# Patient Record
Sex: Female | Born: 1970 | Race: Black or African American | Hispanic: No | Marital: Single | Smoking: Current every day smoker
Health system: Southern US, Community
[De-identification: ages and names within clinical notes are randomized; demographics above are authoritative.]

## PROBLEM LIST (undated history)

## (undated) ENCOUNTER — Emergency Department (HOSPITAL_COMMUNITY): Admission: EM | Payer: Medicare Other | Source: Home / Self Care

## (undated) DIAGNOSIS — M329 Systemic lupus erythematosus, unspecified: Secondary | ICD-10-CM

## (undated) DIAGNOSIS — G5600 Carpal tunnel syndrome, unspecified upper limb: Secondary | ICD-10-CM

## (undated) DIAGNOSIS — IMO0002 Reserved for concepts with insufficient information to code with codable children: Secondary | ICD-10-CM

## (undated) HISTORY — PX: TUBAL LIGATION: SHX77

## (undated) HISTORY — PX: TOTAL HIP ARTHROPLASTY: SHX124

---

## 2009-05-01 ENCOUNTER — Emergency Department (HOSPITAL_COMMUNITY): Admission: EM | Admit: 2009-05-01 | Discharge: 2009-05-01 | Payer: Self-pay | Admitting: Family Medicine

## 2009-05-08 ENCOUNTER — Encounter: Admission: RE | Admit: 2009-05-08 | Discharge: 2009-05-08 | Payer: Self-pay | Admitting: Sports Medicine

## 2009-06-06 ENCOUNTER — Emergency Department (HOSPITAL_COMMUNITY): Admission: EM | Admit: 2009-06-06 | Discharge: 2009-06-06 | Payer: Self-pay | Admitting: Family Medicine

## 2009-06-28 DIAGNOSIS — M1611 Unilateral primary osteoarthritis, right hip: Secondary | ICD-10-CM | POA: Diagnosis present

## 2009-07-25 ENCOUNTER — Ambulatory Visit (HOSPITAL_COMMUNITY): Admission: RE | Admit: 2009-07-25 | Discharge: 2009-07-25 | Payer: Self-pay | Admitting: Orthopedic Surgery

## 2009-07-27 ENCOUNTER — Inpatient Hospital Stay (HOSPITAL_COMMUNITY): Admission: RE | Admit: 2009-07-27 | Discharge: 2009-07-30 | Payer: Self-pay | Admitting: Orthopedic Surgery

## 2010-01-01 DIAGNOSIS — E559 Vitamin D deficiency, unspecified: Secondary | ICD-10-CM | POA: Insufficient documentation

## 2010-03-31 ENCOUNTER — Encounter: Payer: Self-pay | Admitting: Family Medicine

## 2010-05-27 LAB — CBC
HCT: 23.4 % — ABNORMAL LOW (ref 36.0–46.0)
Hemoglobin: 7.9 g/dL — ABNORMAL LOW (ref 12.0–15.0)
MCHC: 33.8 g/dL (ref 30.0–36.0)
MCHC: 34.4 g/dL (ref 30.0–36.0)
MCV: 92.6 fL (ref 78.0–100.0)
Platelets: 181 10*3/uL (ref 150–400)
Platelets: 202 10*3/uL (ref 150–400)
Platelets: 215 10*3/uL (ref 150–400)
RBC: 2.53 MIL/uL — ABNORMAL LOW (ref 3.87–5.11)
RBC: 3.71 MIL/uL — ABNORMAL LOW (ref 3.87–5.11)
RDW: 13.4 % (ref 11.5–15.5)
RDW: 13.5 % (ref 11.5–15.5)
WBC: 10.3 10*3/uL (ref 4.0–10.5)
WBC: 13.3 10*3/uL — ABNORMAL HIGH (ref 4.0–10.5)
WBC: 5.9 10*3/uL (ref 4.0–10.5)

## 2010-05-27 LAB — URINALYSIS, ROUTINE W REFLEX MICROSCOPIC
Bilirubin Urine: NEGATIVE
Bilirubin Urine: NEGATIVE
Glucose, UA: NEGATIVE mg/dL
Ketones, ur: NEGATIVE mg/dL
Ketones, ur: NEGATIVE mg/dL
Nitrite: NEGATIVE
Nitrite: NEGATIVE
Protein, ur: NEGATIVE mg/dL
Specific Gravity, Urine: 1.019 (ref 1.005–1.030)
Specific Gravity, Urine: 1.02 (ref 1.005–1.030)
Urobilinogen, UA: 1 mg/dL (ref 0.0–1.0)
Urobilinogen, UA: 1 mg/dL (ref 0.0–1.0)
pH: 6 (ref 5.0–8.0)

## 2010-05-27 LAB — URINE MICROSCOPIC-ADD ON

## 2010-05-27 LAB — BASIC METABOLIC PANEL
BUN: 10 mg/dL (ref 6–23)
BUN: 2 mg/dL — ABNORMAL LOW (ref 6–23)
CO2: 28 mEq/L (ref 19–32)
Calcium: 8.8 mg/dL (ref 8.4–10.5)
Calcium: 8.9 mg/dL (ref 8.4–10.5)
Calcium: 9.4 mg/dL (ref 8.4–10.5)
Chloride: 96 mEq/L (ref 96–112)
Creatinine, Ser: 0.53 mg/dL (ref 0.4–1.2)
Creatinine, Ser: 0.55 mg/dL (ref 0.4–1.2)
Creatinine, Ser: 0.65 mg/dL (ref 0.4–1.2)
GFR calc Af Amer: >60 mL/min (ref >60)
GFR calc Af Amer: >60 mL/min (ref >60)
GFR calc non Af Amer: >60 mL/min (ref >60)
GFR calc non Af Amer: >60 mL/min (ref >60)
GFR calc non Af Amer: >60 mL/min (ref >60)
Glucose, Bld: 125 mg/dL — ABNORMAL HIGH (ref 70–99)
Glucose, Bld: 99 mg/dL (ref 70–99)
Potassium: 3.8 mEq/L (ref 3.5–5.1)
Sodium: 132 mEq/L — ABNORMAL LOW (ref 135–145)
Sodium: 135 mEq/L (ref 135–145)

## 2010-05-27 LAB — PROTIME-INR
INR: 0.99 (ref 0.00–1.49)
INR: 1.14 (ref 0.00–1.49)
INR: 1.48 (ref 0.00–1.49)
Prothrombin Time: 14.5 seconds (ref 11.6–15.2)
Prothrombin Time: 15.7 seconds — ABNORMAL HIGH (ref 11.6–15.2)
Prothrombin Time: 17.8 seconds — ABNORMAL HIGH (ref 11.6–15.2)

## 2010-05-27 LAB — URINE CULTURE

## 2010-05-27 LAB — DIFFERENTIAL
Basophils Relative: 0 % (ref 0–1)
Monocytes Relative: 12 % (ref 3–12)
Neutro Abs: 2.7 10*3/uL (ref 1.7–7.7)
Neutrophils Relative %: 45 % (ref 43–77)

## 2010-05-27 LAB — APTT: aPTT: 23 seconds — ABNORMAL LOW (ref 24–37)

## 2010-05-27 LAB — TYPE AND SCREEN
ABO/RH(D): A POS
Antibody Screen: NEGATIVE

## 2010-05-28 LAB — CBC
HCT: 33.5 % — ABNORMAL LOW (ref 36.0–46.0)
MCV: 91.3 fL (ref 78.0–100.0)
Platelets: 243 10*3/uL (ref 150–400)
RBC: 3.67 MIL/uL — ABNORMAL LOW (ref 3.87–5.11)
WBC: 6.8 10*3/uL (ref 4.0–10.5)

## 2010-05-28 LAB — BASIC METABOLIC PANEL
Chloride: 101 mEq/L (ref 96–112)
GFR calc Af Amer: >60 mL/min (ref >60)
GFR calc non Af Amer: >60 mL/min (ref >60)
Potassium: 4.4 mEq/L (ref 3.5–5.1)
Sodium: 133 mEq/L — ABNORMAL LOW (ref 135–145)

## 2010-05-28 LAB — PROTIME-INR
INR: 1.02 (ref 0.00–1.49)
Prothrombin Time: 13.3 seconds (ref 11.6–15.2)

## 2010-05-28 LAB — URINALYSIS, ROUTINE W REFLEX MICROSCOPIC
Bilirubin Urine: NEGATIVE
Ketones, ur: NEGATIVE mg/dL
Leukocytes, UA: NEGATIVE
Nitrite: NEGATIVE
Protein, ur: NEGATIVE mg/dL
Urobilinogen, UA: 1 mg/dL (ref 0.0–1.0)
pH: 6.5 (ref 5.0–8.0)

## 2010-05-28 LAB — APTT: aPTT: 26 seconds (ref 24–37)

## 2010-05-28 LAB — TYPE AND SCREEN: Antibody Screen: NEGATIVE

## 2010-05-28 LAB — DIFFERENTIAL
Eosinophils Absolute: 0.1 10*3/uL (ref 0.0–0.7)
Eosinophils Relative: 2 % (ref 0–5)
Lymphocytes Relative: 33 % (ref 12–46)
Lymphs Abs: 2.3 10*3/uL (ref 0.7–4.0)
Monocytes Relative: 9 % (ref 3–12)

## 2010-05-28 LAB — ABO/RH: ABO/RH(D): A POS

## 2011-09-08 ENCOUNTER — Encounter (HOSPITAL_COMMUNITY): Payer: Self-pay | Admitting: Emergency Medicine

## 2011-09-08 ENCOUNTER — Emergency Department (HOSPITAL_COMMUNITY)
Admission: EM | Admit: 2011-09-08 | Discharge: 2011-09-08 | Disposition: A | Discharge status: Home Health | Payer: Medicare Other | Attending: Emergency Medicine | Admitting: Emergency Medicine

## 2011-09-08 DIAGNOSIS — M79609 Pain in unspecified limb: Secondary | ICD-10-CM | POA: Insufficient documentation

## 2011-09-08 DIAGNOSIS — R209 Unspecified disturbances of skin sensation: Secondary | ICD-10-CM | POA: Insufficient documentation

## 2011-09-08 DIAGNOSIS — G629 Polyneuropathy, unspecified: Secondary | ICD-10-CM

## 2011-09-08 DIAGNOSIS — G5600 Carpal tunnel syndrome, unspecified upper limb: Secondary | ICD-10-CM | POA: Insufficient documentation

## 2011-09-08 HISTORY — DX: Carpal tunnel syndrome, unspecified upper limb: G56.00

## 2011-09-08 LAB — GLUCOSE, CAPILLARY: Glucose-Capillary: 107 mg/dL — ABNORMAL HIGH (ref 70–99)

## 2011-09-08 MED ORDER — DICLOFENAC SODIUM 75 MG PO TBEC: 75.0000 mg | DELAYED_RELEASE_TABLET | Freq: Two times a day (BID) | ORAL | Status: AC

## 2011-09-08 NOTE — ED Provider Notes (Signed)
History     CSN: [id number]  Arrival date & time 09/08/11  4098   First MD Initiated Contact with Patient 09/08/11 1001      10:26 AM HPI   Patient is a 41 y.o. female presenting with lower extremity pain. The history is provided by the patient.  Foot Pain This is a new problem. Episode onset: 2 weeks. The problem occurs constantly. The problem has been gradually worsening. Associated symptoms include numbness. Pertinent negatives include no chills, fever, joint swelling, myalgias, nausea, neck pain, vomiting or weakness. The symptoms are aggravated by walking and standing. She has tried rest for the symptoms.    Past Medical History  Diagnosis Date  . Carpal tunnel syndrome     Past Surgical History  Procedure Date  . Total hip arthroplasty     History reviewed. No pertinent family history.  History  Substance Use Topics  . Smoking status: Current Everyday Smoker  . Smokeless tobacco: Not on file  . Alcohol Use: Yes     occasional    OB History    Grav Para Term Preterm Abortions TAB SAB Ect Mult Living                  Review of Systems  Constitutional: Negative for fever and chills.  HENT: Negative for neck pain.   Gastrointestinal: Negative for nausea and vomiting.  Musculoskeletal: Negative for myalgias and joint swelling.       Bilateral foot pain  Neurological: Positive for numbness. Negative for weakness.  All other systems reviewed and are negative.    Allergies  Review of patient's allergies indicates no known allergies.  Home Medications  No current outpatient prescriptions on file.  BP 133/72  Pulse 84  Temp 98.5 F (36.9 C) (Oral)  Resp 20  SpO2 96%  Physical Exam  Vitals reviewed. Constitutional: She is oriented to person, place, and time. Vital signs are normal. She appears well-developed and well-nourished. No distress.  HENT:  Head: Normocephalic and atraumatic.  Eyes: Pupils are equal, round, and reactive to light.  Neck: Neck  supple.  Pulmonary/Chest: Effort normal.  Musculoskeletal:       Bilateral foot: NML pulses. No edema, no erythema, Moderate TTP, no wounds or masses.   Neurological: She is alert and oriented to person, place, and time.  Skin: Skin is warm and dry. No rash noted. No erythema. No pallor.  Psychiatric: She has a normal mood and affect. Her behavior is normal.    ED Course  Procedures   Results for orders placed during the hospital encounter of 09/08/11  GLUCOSE, CAPILLARY      Component Value Range   Glucose-Capillary 107 (*) 70 - 99 mg/dL   Comment 1 BRACELET RIGHT NAME      MDM    Patient is describing a peripheral neuropathy. Will Treat pain with NSAIDs and prednisone. Advised follow-up with PCP or ortho. Patient voices understanding and is ready for d/c     Thomasene Lot, PA-C 09/08/11 1127

## 2011-09-08 NOTE — Discharge Instructions (Signed)
Peripheral Neuropathy Peripheral neuropathy is a common disorder of your nerves resulting from damage. CAUSES  This disorder may be caused by a disease of the nerves or illness. Many neuropathies have well known causes such as:  Diabetes. This is one of the most common causes.   Uremia.   AIDS.   Nutritional deficiencies.   Other causes include mechanical pressures. These may be from:   Compression.   Injury.   Contusions or bruises.   Fracture or dislocated bones.   Pressure involving the nerves close to the surface. Nerves such as the ulnar, or radial can be injured by prolonged use of crutches.  Other injuries may come from:  Tumor.   Hemorrhage or bleeding into a nerve.   Exposure to cold or radiation.   Certain medicines or toxic substances (rare).   Vascular or collagen disorders such as:   Atherosclerosis.   Systemic lupus erythematosus.   Scleroderma.   Sarcoidosis.   Rheumatoid arthritis.   Polyarteritis nodosa.   A large number of cases are of unknown cause.  SYMPTOMS  Common problems include:  Weakness.   Numbness.   Abnormal sensations (paresthesia) such as:   Burning.   Tickling.   Pricking.   Tingling.   Pain in the arms, hands, legs and/or feet.  TREATMENT  Therapy for this disorder differs depending on the cause. It may vary from medical treatment with medications or physical therapy among others.   For example, therapy for this disorder caused by diabetes involves control of the diabetes.   In cases where a tumor or ruptured disc is the cause, therapy may involve surgery. This would be to remove the tumor or to repair the ruptured disc.   In entrapment or compression neuropathy, treatment may consist of splinting or surgical decompression of the ulnar or median nerves. A common example of entrapment neuropathy is carpal tunnel syndrome. This has become more common because of the increasing use of computers.   Peroneal and  radial compression neuropathies may require avoidance of pressure.   Physical therapy and/or splints may be useful in preventing contractures. This is a condition in which shortened muscles around joints cause abnormal and sometimes painful positioning of the joints.  Document Released: 02/14/2002 Document Revised: 11/06/2010 Document Reviewed: 02/24/2005 [employer] Patient Information 2012 [employer]. 

## 2011-09-08 NOTE — ED Provider Notes (Signed)
Medical screening examination/treatment/procedure(s) were performed by non-physician practitioner and as supervising physician I was immediately available for consultation/collaboration.   Glynn Octave, MD 09/08/11 [phone]

## 2011-09-08 NOTE — ED Notes (Signed)
Pt c/o bilateral foot pain and hip pain x 2 months; pt denies obvious injury; pt sts hx of hip replacement

## 2013-03-13 ENCOUNTER — Encounter (HOSPITAL_COMMUNITY): Payer: Self-pay | Admitting: Emergency Medicine

## 2013-03-13 ENCOUNTER — Emergency Department (HOSPITAL_COMMUNITY): Payer: Medicare Other

## 2013-03-13 ENCOUNTER — Emergency Department (HOSPITAL_COMMUNITY)
Admission: EM | Admit: 2013-03-13 | Discharge: 2013-03-13 | Disposition: A | Discharge status: Home / Self Care | Payer: Medicare Other | Attending: Emergency Medicine | Admitting: Emergency Medicine

## 2013-03-13 DIAGNOSIS — M25559 Pain in unspecified hip: Secondary | ICD-10-CM | POA: Insufficient documentation

## 2013-03-13 DIAGNOSIS — G5601 Carpal tunnel syndrome, right upper limb: Secondary | ICD-10-CM

## 2013-03-13 DIAGNOSIS — F17200 Nicotine dependence, unspecified, uncomplicated: Secondary | ICD-10-CM | POA: Insufficient documentation

## 2013-03-13 DIAGNOSIS — G8929 Other chronic pain: Secondary | ICD-10-CM | POA: Insufficient documentation

## 2013-03-13 DIAGNOSIS — Z9889 Other specified postprocedural states: Secondary | ICD-10-CM | POA: Insufficient documentation

## 2013-03-13 DIAGNOSIS — G5600 Carpal tunnel syndrome, unspecified upper limb: Secondary | ICD-10-CM | POA: Insufficient documentation

## 2013-03-13 MED ORDER — IBUPROFEN 800 MG PO TABS: 800.0000 mg | ORAL_TABLET | Freq: Three times a day (TID) | ORAL | Status: DC

## 2013-03-13 MED ORDER — HYDROCODONE-ACETAMINOPHEN 5-325 MG PO TABS: 1.0000 | ORAL_TABLET | Freq: Four times a day (QID) | ORAL | Status: DC | PRN

## 2013-03-13 NOTE — ED Notes (Signed)
Reports pain to right hip for several days. Had hip replaced 3 years ago but denies new injury. Having right hand pain also, hx of carpel tunnel. No acute distress noted at triage.

## 2013-03-13 NOTE — ED Notes (Signed)
Patient transported to X-ray 

## 2013-03-13 NOTE — ED Provider Notes (Signed)
CSN: [id number]     Arrival date & time 03/13/13  1102 History  This chart was scribed for Santiago Glad, PA working with Rolan Bucco, MD by Quintella Reichert, ED Scribe. This patient was seen in room TR08C/TR08C and the patient's care was started at 12:30 PM.   Chief Complaint  Patient presents with  . Hip Pain  . Hand Pain    The history is provided by the patient. No language interpreter was used.    HPI Comments: Lisa Foley is a 43 y.o. female who presents to the Emergency Department complaining of 2 days of constant moderate right hip pain.  Pt has h/o total hip replacement around 3 years ago (Dr. Turner Daniels) and has had some pain recurrently since then.  It had been some time since her flare-up before her current episode.  Pain is constant and is not worsened by anything.  She denies redness or swelling of the hip.  She denies recent fevers or chills.  Pt has not attempted to treat pain pta due to alleged possible allergies to Tylenol and Aleve.  However she states that she has taken 800mg  ibuprofen before without complications.  She also received Percocet after her previous surgery without adverse effect.  Pt also complains of one month of intermittent pain and numbness to the right hand.  She has h/o carpal tunnel and uses a splint but states it has not provided relief.  She received carpal tunnel release surgery around 1996 in [place].  She has been told that she may need another surgery.   Past Medical History  Diagnosis Date  . Carpal tunnel syndrome     Past Surgical History  Procedure Laterality Date  . Total hip arthroplasty    . Tubal ligation      History reviewed. No pertinent family history.   History  Substance Use Topics  . Smoking status: Current Every Day Smoker  . Smokeless tobacco: Not on file  . Alcohol Use: Yes     Comment: occasional    OB History   Grav Para Term Preterm Abortions TAB SAB Ect Mult Living                  Review of  Systems  Constitutional: Negative for fever and chills.  Musculoskeletal: Positive for arthralgias (right hip, right hand). Negative for joint swelling.     Allergies  Aleve and Tylenol  Home Medications  No current outpatient prescriptions on file.  BP 136/77  Pulse 91  Temp(Src) 99 F (37.2 C) (Oral)  Resp 20  Ht 5\' 7"  (1.702 m)  SpO2 97%  LMP 02/18/2013  Physical Exam  Nursing note and vitals reviewed. Constitutional: She appears well-developed and well-nourished.  HENT:  Head: Normocephalic and atraumatic.  Mouth/Throat: Oropharynx is clear and moist.  Eyes: EOM are normal. Pupils are equal, round, and reactive to light.  Neck: Normal range of motion. Neck supple.  Cardiovascular: Normal rate, regular rhythm and normal heart sounds.   Pulses:      Dorsalis pedis pulses are 2+ on the right side.  Pulmonary/Chest: Effort normal and breath sounds normal. She has no wheezes.  Musculoskeletal:       Right hip: She exhibits tenderness.  Tender to palpation over right lateral hip.  No erythema, edema or warmth.  Full ROM but mild pain with ROM, particularly abduction.   Old healed surgical scar over volar aspect of the right wrist.  Distal sensation of right fingers intact.  No erythema,  edema or wamrth of the wrist present.    Neurological: She is alert. No sensory deficit.  Skin: Skin is warm and dry.  Psychiatric: She has a normal mood and affect. Her behavior is normal.    ED Course  Procedures (including critical care time)  DIAGNOSTIC STUDIES: Oxygen Saturation is 97% on room air, normal by my interpretation.    COORDINATION OF CARE: 12:41 PM-Discussed treatment plan which includes x-ray with pt at bedside and pt agreed to plan.    Labs Review Labs Reviewed - No data to display   Imaging Review Dg Hip Complete Right  03/13/2013   CLINICAL DATA:  Right lateral hip pain.  EXAM: RIGHT HIP - COMPLETE 2+ VIEW  COMPARISON:  07/27/2009  FINDINGS: Right hip  prosthesis in place. Subtle lucency an bone adjacent to the acetabular component is stable from the immediate postoperative images and accordingly not ascribed to particulate disease. No fracture, acute bony finding, or complicating feature observed. There is some mild cortical thickening along the stem component of the implant.  IMPRESSION: 1. Subtle cortical thickening along the stem of the implant without cortical fracture. This may simply represent mild reactive thickening. No definite complicating feature. If pain persists despite conservative therapy, MARS protocol MRI may be helpful in further characterization.   Electronically Signed   By: Herbie BaltimoreWalt  Liebkemann M.D.   On: 03/13/2013 14:04    EKG Interpretation   None       MDM  No diagnosis found. Patient presenting with chronic hip pain.  History of hip replacement.  No signs of infection on exam.  No acute injury or trauma.  Patient discharged home with pain medications and instructed to follow up with Orthopedist.  Patient also complaining of one month of intermittent wrist pain and numbness.  Patient has had Carpal Tunnel surgery in the past. Patient instructed to continue wearing splint and given follow up with Hand Surgery.  Patient stable for discharge.  I personally performed the services described in this documentation, which was scribed in my presence. The recorded information has been reviewed and is accurate.    Santiago GladHeather Ameen Mostafa, PA-C 03/15/13 2228

## 2013-03-15 NOTE — ED Provider Notes (Signed)
Medical screening examination/treatment/procedure(s) were performed by non-physician practitioner and as supervising physician I was immediately available for consultation/collaboration.  EKG Interpretation   None         Wayne Wicklund, MD 03/15/13 2302 

## 2013-04-12 ENCOUNTER — Emergency Department (HOSPITAL_COMMUNITY): Payer: Medicare Other

## 2013-04-12 ENCOUNTER — Emergency Department (HOSPITAL_COMMUNITY)
Admission: EM | Admit: 2013-04-12 | Discharge: 2013-04-12 | Disposition: A | Discharge status: Home / Self Care | Payer: Medicare Other | Attending: Emergency Medicine | Admitting: Emergency Medicine

## 2013-04-12 ENCOUNTER — Encounter (HOSPITAL_COMMUNITY): Payer: Self-pay | Admitting: Emergency Medicine

## 2013-04-12 DIAGNOSIS — M25559 Pain in unspecified hip: Secondary | ICD-10-CM | POA: Insufficient documentation

## 2013-04-12 DIAGNOSIS — B372 Candidiasis of skin and nail: Secondary | ICD-10-CM | POA: Insufficient documentation

## 2013-04-12 DIAGNOSIS — M25551 Pain in right hip: Secondary | ICD-10-CM

## 2013-04-12 DIAGNOSIS — Z9889 Other specified postprocedural states: Secondary | ICD-10-CM | POA: Insufficient documentation

## 2013-04-12 DIAGNOSIS — F17200 Nicotine dependence, unspecified, uncomplicated: Secondary | ICD-10-CM | POA: Insufficient documentation

## 2013-04-12 DIAGNOSIS — R21 Rash and other nonspecific skin eruption: Secondary | ICD-10-CM | POA: Insufficient documentation

## 2013-04-12 DIAGNOSIS — Z96649 Presence of unspecified artificial hip joint: Secondary | ICD-10-CM | POA: Insufficient documentation

## 2013-04-12 DIAGNOSIS — M25539 Pain in unspecified wrist: Secondary | ICD-10-CM | POA: Insufficient documentation

## 2013-04-12 DIAGNOSIS — M25531 Pain in right wrist: Secondary | ICD-10-CM

## 2013-04-12 DIAGNOSIS — Z79899 Other long term (current) drug therapy: Secondary | ICD-10-CM | POA: Insufficient documentation

## 2013-04-12 DIAGNOSIS — Z791 Long term (current) use of non-steroidal anti-inflammatories (NSAID): Secondary | ICD-10-CM | POA: Insufficient documentation

## 2013-04-12 MED ORDER — TRAMADOL HCL 50 MG PO TABS: 50.0000 mg | ORAL_TABLET | Freq: Four times a day (QID) | ORAL | Status: DC | PRN

## 2013-04-12 MED ORDER — CLOTRIMAZOLE 1 % EX CREA: TOPICAL_CREAM | CUTANEOUS | Status: DC

## 2013-04-12 NOTE — ED Provider Notes (Signed)
CSN: [id number]     Arrival date & time 04/12/13  1105 History   First MD Initiated Contact with Patient 04/12/13 1122    This chart was scribed for Francee PiccoloJennifer Whitnie Deleon PA-C, a non-physician practitioner working with No att. providers found by Lewanda RifeAlexandra Hurtado, ED Scribe. This patient was seen in room TR04C/TR04C and the patient's care was started at 12:38 PM     Chief Complaint  Patient presents with  . Hip Pain  . Hand Pain   (Consider location/radiation/quality/duration/timing/severity/associated sxs/prior Treatment) The history is provided by the patient. No language interpreter was used.   HPI Comments: Lisa Foley is a 43 y.o. female who presents to the Emergency Department with PMHx of right hip replacement (3 years), and carpal tunnel surgery of right hand (1996) complaining of right hip pain and right hand pain onset chronic, but worsening gradually for the past 2 weeks with no precipitating factors. Describes pain as sharp. Reports associated Reports symptoms are exacerbated at night. Denies any alleviating factors. Denies associated fever, recent fall, and recent trama, numbness, and weakness.  Additionally, reports constant worsening pruritic rash on back onset gradually worsening for 2 months. Reports she has a dog at home. Reports trying gold bond lotion with no relief of symptoms. Denies any aggravating factors. Denies associated new food, new medications, new lotions, new residence, new soaps, drainage, any pain, skin color change other than upper back, and other areas with similar rash.       Past Medical History  Diagnosis Date  . Carpal tunnel syndrome    Past Surgical History  Procedure Laterality Date  . Total hip arthroplasty    . Tubal ligation     No family history on file. History  Substance Use Topics  . Smoking status: Current Every Day Smoker  . Smokeless tobacco: Not on file  . Alcohol Use: Yes     Comment: occasional   OB History   Grav Para  Term Preterm Abortions TAB SAB Ect Mult Living                 Review of Systems  Constitutional: Negative for fever.  Musculoskeletal: Positive for arthralgias.  Skin: Positive for rash.  Neurological: Negative for weakness and numbness.  Psychiatric/Behavioral: Negative for confusion.    Allergies  Aleve and Tylenol  Home Medications   Current Outpatient Rx  Name  Route  Sig  Dispense  Refill  . HYDROcodone-acetaminophen (NORCO/VICODIN) 5-325 MG per tablet   Oral   Take 1-2 tablets by mouth every 6 (six) hours as needed.   20 tablet   0   . ibuprofen (ADVIL,MOTRIN) 800 MG tablet   Oral   Take 1 tablet (800 mg total) by mouth 3 (three) times daily.   21 tablet   0   . clotrimazole (LOTRIMIN) 1 % cream      Apply to affected area 2 times daily up to four weeks   15 g   0   . traMADol (ULTRAM) 50 MG tablet   Oral   Take 1 tablet (50 mg total) by mouth every 6 (six) hours as needed.   15 tablet   0    BP 117/62  Pulse 81  Temp(Src) 97.9 F (36.6 C) (Oral)  Resp 16  SpO2 100%  LMP 02/18/2013 Physical Exam  Constitutional: She is oriented to person, place, and time. She appears well-developed and well-nourished. No distress.  HENT:  Head: Normocephalic and atraumatic.  Right Ear: External ear normal.  Left Ear: External ear normal.  Nose: Nose normal.  Mouth/Throat: Oropharynx is clear and moist.  Eyes: Conjunctivae are normal.  Neck: Normal range of motion. Neck supple.  Cardiovascular: Normal rate.   Pulmonary/Chest: Effort normal.  Abdominal: Soft.  Musculoskeletal: Normal range of motion.  Neurological: She is alert and oriented to person, place, and time.  Skin: Skin is warm and dry. She is not diaphoretic.  Non-tender and non-vesicular. Rash consistent with candida noted to areas of skin folds on back.   Psychiatric: She has a normal mood and affect.    ED Course  Procedures  COORDINATION OF CARE:  Nursing notes reviewed. Vital signs  reviewed. Initial pt interview and examination performed.   12:39 PM-Discussed work up plan with pt at bedside, which includes x-ray of right hip pain, and x-ray of right hand. Pt agrees with plan.   Treatment plan initiated:Medications - No data to display   Initial diagnostic testing ordered.     Labs Review Labs Reviewed - No data to display Imaging Review Dg Hip Complete Right  04/12/2013   CLINICAL DATA:  Right hip pain.  Prior hip arthroplasty.  EXAM: RIGHT HIP - COMPLETE 2+ VIEW  COMPARISON:  DG HIP COMPLETE*R* dated 03/13/2013  FINDINGS: Stable radiographic appearance following prior placement of a right hip arthroplasty. No fracture or abnormal lucency is seen surrounding the prosthesis. There is stable mild cortical thickening along the medial aspect of the femoral stem.  IMPRESSION: Stable radiographic appearance of right hip arthroplasty.   Electronically Signed   By: Irish Lack M.D.   On: 04/12/2013 12:29   Dg Hand Complete Right  04/12/2013   CLINICAL DATA:  Right hand pain.  EXAM: RIGHT HAND - COMPLETE 3+ VIEW  COMPARISON:  None.  FINDINGS: There is no evidence of fracture or dislocation. There is no evidence of arthropathy or other focal bone abnormality. Soft tissues are unremarkable.  IMPRESSION: Negative.   Electronically Signed   By: Irish Lack M.D.   On: 04/12/2013 12:25   12:42 PM Nursing Notes Reviewed/ Care Coordinated Applicable Imaging Reviewed and incorporated into ED treatment Discussed results and treatment plan with pt. Pt demonstrates understanding and agrees with plan. Pt informed of return precautions and is comfortable with discharge at this time.      EKG Interpretation   None       MDM   1. Right wrist pain   2. Right hip pain   3. Rash and nonspecific skin eruption     Filed Vitals:   04/12/13 1320  BP: 117/62  Pulse: 81  Temp: 97.9 F (36.6 C)  Resp: 16   Afebrile, NAD, non-toxic appearing, AAOx4. Neurovascularly intact. Normal  sensation. Imaging reviewed.  1) Right wrist pain: pain consistent with carpal tunnel will brace wrist and provide hand surgeon f/u.  2) Hip Pain: No obvious injury. Likely musculoskeletal in nature. Will prescribe pain medication and ortho f/u.  3) Rash: Rash consistent with candidal infection, will prescribe topical antifungal.  Return precautions discussed. Patient is agreeable to plan. Patient is stable at time of discharge     I personally performed the services described in this documentation, which was scribed in my presence. The recorded information has been reviewed and is accurate.     Jeannetta Ellis, PA-C 04/12/13 1635

## 2013-04-12 NOTE — ED Notes (Signed)
Pt reports right hip pain and right hand pain. Pt has had hip replacement years ago and carpal tunnel surgery to right hand. Also reports rash to back x months.

## 2013-04-12 NOTE — Discharge Instructions (Signed)
Please follow up with your primary care physician in 1-2 days. If you do not have one please call the [facility] number listed above. Please follow up with Dr. Merlyn Lot, hand surgeon, and Dr. Eulah Pont, orthopedist, to schedule a follow up appointment. Please take pain medication as prescribed and as needed for pain. Please do not drive on narcotic pain medication. Please use cream as prescribed. Please read all discharge instructions and return precautions.   Rash A rash is a change in the color or texture of your skin. There are many different types of rashes. You may have other problems that accompany your rash. CAUSES   Infections.  Allergic reactions. This can include allergies to pets or foods.  Certain medicines.  Exposure to certain chemicals, soaps, or cosmetics.  Heat.  Exposure to poisonous plants.  Tumors, both cancerous and noncancerous. SYMPTOMS   Redness.  Scaly skin.  Itchy skin.  Dry or cracked skin.  Bumps.  Blisters.  Pain. DIAGNOSIS  Your caregiver may do a physical exam to determine what type of rash you have. A skin sample (biopsy) may be taken and examined under a microscope. TREATMENT  Treatment depends on the type of rash you have. Your caregiver may prescribe certain medicines. For serious conditions, you may need to see a skin doctor (dermatologist). HOME CARE INSTRUCTIONS   Avoid the substance that caused your rash.  Do not scratch your rash. This can cause infection.  You may take cool baths to help stop itching.  Only take over-the-counter or prescription medicines as directed by your caregiver.  Keep all follow-up appointments as directed by your caregiver. SEEK IMMEDIATE MEDICAL CARE IF:  You have increasing pain, swelling, or redness.  You have a fever.  You have new or severe symptoms.  You have body aches, diarrhea, or vomiting.  Your rash is not better after 3 days. MAKE SURE YOU:  Understand these  instructions.  Will watch your condition.  Will get help right away if you are not doing well or get worse. Document Released: 02/14/2002 Document Revised: 05/19/2011 Document Reviewed: 12/09/2010 [facility] Patient Information 2014 [place], [place].   Hip Pain The hips join the upper legs to the lower pelvis. The bones, cartilage, tendons, and muscles of the hip joint perform a lot of work each day holding your body weight and allowing you to move around. Hip pain is a common symptom. It can range from a minor ache to severe pain on 1 or both hips. Pain may be felt on the inside of the hip joint near the groin, or the outside near the buttocks and upper thigh. There may be swelling or stiffness as well. It occurs more often when a person walks or performs activity. There are many reasons hip pain can develop. CAUSES  It is important to work with your caregiver to identify the cause since many conditions can impact the bones, cartilage, muscles, and tendons of the hips. Causes for hip pain include:  Broken (fractured) bones.  Separation of the thighbone from the hip socket (dislocation).  Torn cartilage of the hip joint.  Swelling (inflammation) of a tendon (tendonitis), the sac within the hip joint (bursitis), or a joint.  A weakening in the abdominal wall (hernia), affecting the nerves to the hip.  Arthritis in the hip joint or lining of the hip joint.  Pinched nerves in the back, hip, or upper thigh.  A bulging disc in the spine (herniated disc).  Rarely, bone infection or  cancer. DIAGNOSIS  The location of your hip pain will help your caregiver understand what may be causing the pain. A diagnosis is based on your medical history, your symptoms, results from your physical exam, and results from diagnostic tests. Diagnostic tests may include X-ray exams, a computerized magnetic scan (magnetic resonance imaging, MRI), or bone scan. TREATMENT  Treatment will depend on the cause of  your hip pain. Treatment may include:  Limiting activities and resting until symptoms improve.  Crutches or other walking supports (a cane or brace).  Ice, elevation, and compression.  Physical therapy or home exercises.  Shoe inserts or special shoes.  Losing weight.  Medications to reduce pain.  Undergoing surgery. HOME CARE INSTRUCTIONS   Only take over-the-counter or prescription medicines for pain, discomfort, or fever as directed by your caregiver.  Put ice on the injured area:  Put ice in a plastic bag.  Place a towel between your skin and the bag.  Leave the ice on for 15-20 minutes at a time, 03-04 times a day.  Keep your leg raised (elevated) when possible to lessen swelling.  Avoid activities that cause pain.  Follow specific exercises as directed by your caregiver.  Sleep with a pillow between your legs on your most comfortable side.  Record how often you have hip pain, the location of the pain, and what it feels like. This information may be helpful to you and your caregiver.  Ask your caregiver about returning to work or sports and whether you should drive.  Follow up with your caregiver for further exams, therapy, or testing as directed. SEEK MEDICAL CARE IF:   Your pain or swelling continues or worsens after 1 week.  You are feeling unwell or have chills.  You have increasing difficulty with walking.  You have a loss of sensation or other new symptoms.  You have questions or concerns. SEEK IMMEDIATE MEDICAL CARE IF:   You cannot put weight on the affected hip.  You have fallen.  You have a sudden increase in pain and swelling in your hip.  You have a fever. MAKE SURE YOU:   Understand these instructions.  Will watch your condition.  Will get help right away if you are not doing well or get worse. Document Released: 08/14/2009 Document Revised: 05/19/2011 Document Reviewed: 08/14/2009 [facility] Patient Information 2014 [place],  [place].  Carpal Tunnel Syndrome The carpal tunnel is a narrow area located on the palm side of your wrist. The tunnel is formed by the wrist bones and ligaments. Nerves, blood vessels, and tendons pass through the carpal tunnel. Repeated wrist motion or certain diseases may cause swelling within the tunnel. This swelling pinches the main nerve in the wrist (median nerve) and causes the painful hand and arm condition called carpal tunnel syndrome. CAUSES   Repeated wrist motions.  Wrist injuries.  Certain diseases like arthritis, diabetes, alcoholism, hyperthyroidism, and kidney failure.  Obesity.  Pregnancy. SYMPTOMS   A "pins and needles" feeling in your fingers or hand.  Tingling or numbness in your fingers or hand.  An aching feeling in your entire arm.  Wrist pain that goes up your arm to your shoulder.  Pain that goes down into your palm or fingers.  A weak feeling in your hands. DIAGNOSIS  Your caregiver will take your history and perform a physical exam. An electromyography test may be needed. This test measures electrical signals sent out by the muscles. The electrical signals are usually slowed by carpal tunnel syndrome. You  may also need X-rays. TREATMENT  Carpal tunnel syndrome may clear up by itself. Your caregiver may recommend a wrist splint or medicine such as a nonsteroidal anti-inflammatory medicine. Cortisone injections may help. Sometimes, surgery may be needed to free the pinched nerve.  HOME CARE INSTRUCTIONS   Take all medicine as directed by your caregiver. Only take over-the-counter or prescription medicines for pain, discomfort, or fever as directed by your caregiver.  If you were given a splint to keep your wrist from bending, wear it as directed. It is important to wear the splint at night. Wear the splint for as long as you have pain or numbness in your hand, arm, or wrist. This may take 1 to 2 months.  Rest your wrist from any activity that may be  causing your pain. If your symptoms are work-related, you may need to talk to your employer about changing to a job that does not require using your wrist.  Put ice on your wrist after long periods of wrist activity.  Put ice in a plastic bag.  Place a towel between your skin and the bag.  Leave the ice on for 15-20 minutes, 03-04 times a day.  Keep all follow-up visits as directed by your caregiver. This includes any orthopedic referrals, physical therapy, and rehabilitation. Any delay in getting necessary care could result in a delay or failure of your condition to heal. SEEK IMMEDIATE MEDICAL CARE IF:   You have new, unexplained symptoms.  Your symptoms get worse and are not helped or controlled with medicines. MAKE SURE YOU:   Understand these instructions.  Will watch your condition.  Will get help right away if you are not doing well or get worse. Document Released: 02/22/2000 Document Revised: 05/19/2011 Document Reviewed: 01/10/2011 [facility] Patient Information 2014 [place].

## 2013-04-13 NOTE — ED Provider Notes (Signed)
Medical screening examination/treatment/procedure(s) were performed by non-physician practitioner and as supervising physician I was immediately available for consultation/collaboration.  EKG Interpretation   None         Charles B. Sheldon, MD 04/13/13 1610 

## 2014-03-12 ENCOUNTER — Emergency Department (HOSPITAL_COMMUNITY)
Admission: EM | Admit: 2014-03-12 | Discharge: 2014-03-12 | Disposition: A | Discharge status: Home / Self Care | Payer: Medicare Other | Attending: Emergency Medicine | Admitting: Emergency Medicine

## 2014-03-12 ENCOUNTER — Encounter (HOSPITAL_COMMUNITY): Payer: Self-pay | Admitting: *Deleted

## 2014-03-12 DIAGNOSIS — Z720 Tobacco use: Secondary | ICD-10-CM | POA: Diagnosis not present

## 2014-03-12 DIAGNOSIS — E271 Primary adrenocortical insufficiency: Secondary | ICD-10-CM

## 2014-03-12 DIAGNOSIS — R21 Rash and other nonspecific skin eruption: Secondary | ICD-10-CM

## 2014-03-12 DIAGNOSIS — Z8669 Personal history of other diseases of the nervous system and sense organs: Secondary | ICD-10-CM | POA: Insufficient documentation

## 2014-03-12 DIAGNOSIS — Z791 Long term (current) use of non-steroidal anti-inflammatories (NSAID): Secondary | ICD-10-CM | POA: Insufficient documentation

## 2014-03-12 MED ORDER — HYDROXYZINE HCL 25 MG PO TABS: 25.0000 mg | ORAL_TABLET | Freq: Four times a day (QID) | ORAL | Status: DC

## 2014-03-12 MED ORDER — KETOCONAZOLE 2 % EX CREA: 1.0000 "application " | TOPICAL_CREAM | Freq: Every day | CUTANEOUS | Status: DC

## 2014-03-12 NOTE — ED Notes (Signed)
Pt reports having rash and itching to entire body x 1 year.

## 2014-03-12 NOTE — Discharge Instructions (Signed)
Return here as needed.  Follow-up with the dermatologist provided °

## 2014-03-12 NOTE — ED Provider Notes (Signed)
CSN: [id number]     Arrival date & time 03/12/14  1158 History   First MD Initiated Contact with Patient 03/12/14 1313     Chief Complaint  Patient presents with  . Rash     (Consider location/radiation/quality/duration/timing/severity/associated sxs/prior Treatment) HPI Patient presents to the emergency department with a rash that is been ongoing for the past year.  Patient states that it started on her left forearm and now she has rash on her right forearm, back and chest.  The patient states that it discoloration of her skin is dark and red.  The patient states that she has not had any fevers, nausea, vomiting, diarrhea, weakness, headache, blurred vision, back pain, cough, runny nose, sore throat.  She also states that areas.  It chest and she feels like a second developing on her face she has been here in the emergency department multiple times for this.  She states she also saw her primary care doctor who gave her the same cream that we gave her Past Medical History  Diagnosis Date  . Carpal tunnel syndrome    Past Surgical History  Procedure Laterality Date  . Total hip arthroplasty    . Tubal ligation     History reviewed. No pertinent family history. History  Substance Use Topics  . Smoking status: Current Every Day Smoker  . Smokeless tobacco: Not on file  . Alcohol Use: Yes     Comment: occasional   OB History    No data available     Review of Systems  All other systems negative except as documented in the HPI. All pertinent positives and negatives as reviewed in the HPI.  Allergies  Aleve and Tylenol  Home Medications   Prior to Admission medications   Medication Sig Start Date End Date Taking? Authorizing Provider  clotrimazole (LOTRIMIN) 1 % cream Apply to affected area 2 times daily up to four weeks 04/12/13   Lise Auer Piepenbrink, PA-C  HYDROcodone-acetaminophen (NORCO/VICODIN) 5-325 MG per tablet Take 1-2 tablets by mouth every 6 (six) hours as needed.  03/13/13   Heather Laisure, PA-C  ibuprofen (ADVIL,MOTRIN) 800 MG tablet Take 1 tablet (800 mg total) by mouth 3 (three) times daily. 03/13/13   Heather Laisure, PA-C  traMADol (ULTRAM) 50 MG tablet Take 1 tablet (50 mg total) by mouth every 6 (six) hours as needed. 04/12/13   Jennifer L Piepenbrink, PA-C   BP 130/88 mmHg  Pulse 86  Temp(Src) 98.3 F (36.8 C) (Oral)  Resp 18  SpO2 99% Physical Exam  Constitutional: She is oriented to person, place, and time. She appears well-developed and well-nourished.  HENT:  Head: Normocephalic and atraumatic.  Cardiovascular: Normal rate, regular rhythm and normal heart sounds.  Exam reveals no gallop and no friction rub.   No murmur heard. Pulmonary/Chest: Effort normal and breath sounds normal.  Neurological: She is alert and oriented to person, place, and time.  Skin: Skin is warm and dry. Rash noted.       ED Course  Procedures (including critical care time)  Patient will most likely need referral to dermatology for further evaluation of these areas.  Patient is advised with plan and all questions were answered MDM   Final diagnoses:  None        Carlyle Dolly, PA-C 03/12/14 1419  Gwyneth Sprout, MD 03/13/14 1517

## 2014-11-14 ENCOUNTER — Encounter (HOSPITAL_COMMUNITY): Payer: Self-pay | Admitting: Emergency Medicine

## 2014-11-14 ENCOUNTER — Emergency Department (HOSPITAL_COMMUNITY)
Admission: EM | Admit: 2014-11-14 | Discharge: 2014-11-15 | Disposition: A | Discharge status: Home / Self Care | Payer: Medicare Other | Attending: Emergency Medicine | Admitting: Emergency Medicine

## 2014-11-14 DIAGNOSIS — Z79899 Other long term (current) drug therapy: Secondary | ICD-10-CM | POA: Diagnosis not present

## 2014-11-14 DIAGNOSIS — Z8669 Personal history of other diseases of the nervous system and sense organs: Secondary | ICD-10-CM | POA: Diagnosis not present

## 2014-11-14 DIAGNOSIS — Z3202 Encounter for pregnancy test, result negative: Secondary | ICD-10-CM | POA: Diagnosis not present

## 2014-11-14 DIAGNOSIS — R52 Pain, unspecified: Secondary | ICD-10-CM | POA: Insufficient documentation

## 2014-11-14 DIAGNOSIS — Z720 Tobacco use: Secondary | ICD-10-CM | POA: Insufficient documentation

## 2014-11-14 LAB — PREGNANCY, URINE: PREG TEST UR: NEGATIVE

## 2014-11-14 LAB — URINALYSIS, ROUTINE W REFLEX MICROSCOPIC
Bilirubin Urine: NEGATIVE
GLUCOSE, UA: NEGATIVE mg/dL
HGB URINE DIPSTICK: NEGATIVE
KETONES UR: NEGATIVE mg/dL
LEUKOCYTES UA: NEGATIVE
Nitrite: NEGATIVE
PROTEIN: NEGATIVE mg/dL
Specific Gravity, Urine: 1.022 (ref 1.005–1.030)
UROBILINOGEN UA: 1 mg/dL (ref 0.0–1.0)
pH: 5 (ref 5.0–8.0)

## 2014-11-14 LAB — BASIC METABOLIC PANEL
ANION GAP: 8 (ref 5–15)
BUN: 7 mg/dL (ref 6–20)
CALCIUM: 9.3 mg/dL (ref 8.9–10.3)
CO2: 24 mmol/L (ref 22–32)
CREATININE: 0.61 mg/dL (ref 0.44–1.00)
Chloride: 102 mmol/L (ref 101–111)
Glucose, Bld: 85 mg/dL (ref 65–99)
Potassium: 4.4 mmol/L (ref 3.5–5.1)
SODIUM: 134 mmol/L — AB (ref 135–145)

## 2014-11-14 LAB — CBC WITH DIFFERENTIAL/PLATELET
BASOS ABS: 0 10*3/uL (ref 0.0–0.1)
BASOS PCT: 0 % (ref 0–1)
EOS ABS: 0.2 10*3/uL (ref 0.0–0.7)
Eosinophils Relative: 3 % (ref 0–5)
HEMATOCRIT: 37.8 % (ref 36.0–46.0)
HEMOGLOBIN: 12.4 g/dL (ref 12.0–15.0)
Lymphocytes Relative: 28 % (ref 12–46)
Lymphs Abs: 2.2 10*3/uL (ref 0.7–4.0)
MCH: 31.6 pg (ref 26.0–34.0)
MCHC: 32.8 g/dL (ref 30.0–36.0)
MCV: 96.4 fL (ref 78.0–100.0)
Monocytes Absolute: 0.8 10*3/uL (ref 0.1–1.0)
Monocytes Relative: 10 % (ref 3–12)
NEUTROS ABS: 4.6 10*3/uL (ref 1.7–7.7)
NEUTROS PCT: 59 % (ref 43–77)
Platelets: 260 10*3/uL (ref 150–400)
RBC: 3.92 MIL/uL (ref 3.87–5.11)
RDW: 13.1 % (ref 11.5–15.5)
WBC: 7.8 10*3/uL (ref 4.0–10.5)

## 2014-11-14 MED ORDER — PREDNISONE 10 MG PO TABS: ORAL_TABLET | ORAL | Status: DC

## 2014-11-14 MED ORDER — ONDANSETRON 4 MG PO TBDP
4.0000 mg | ORAL_TABLET | Freq: Once | ORAL | Status: AC
  Administered 2014-11-14: 4 mg via ORAL
  Filled 2014-11-14: qty 1

## 2014-11-14 MED ORDER — OXYCODONE HCL 5 MG PO TABS
5.0000 mg | ORAL_TABLET | Freq: Once | ORAL | Status: AC
  Administered 2014-11-14: 5 mg via ORAL
  Filled 2014-11-14: qty 1

## 2014-11-14 MED ORDER — PREDNISONE 20 MG PO TABS
20.0000 mg | ORAL_TABLET | Freq: Once | ORAL | Status: AC
  Administered 2014-11-14: 20 mg via ORAL
  Filled 2014-11-14: qty 1

## 2014-11-14 NOTE — ED Notes (Signed)
EDP at bedside  

## 2014-11-14 NOTE — ED Notes (Signed)
MD at bedside updating pt

## 2014-11-14 NOTE — ED Notes (Signed)
Pt sts generalized body aches that feels like a lupus flare with some swelling of hands

## 2014-11-14 NOTE — ED Provider Notes (Signed)
CSN: [id number]     Arrival date & time 11/14/14  1658 History   First MD Initiated Contact with Patient 11/14/14 2011     Chief Complaint  Patient presents with  . Generalized Body Aches     (Consider location/radiation/quality/duration/timing/severity/associated sxs/prior Treatment) HPI Comments: 44yo F w/ h/o lupus who presents with generalized body aches. The patient states that for the past 4 days, she has had worsening generalized body aches and joint pains that are consistent with a lupus flare. She has noticed some swelling of her hands and has noticed new places of rash on her legs. She has had rashes on her arms and back for the past one year. She denies any dysuria or hematuria. She had one episode of vomiting today and some mild diarrhea but no fevers. No chest pain or shortness of breath. No vaginal bleeding or discharge. She notes that she did have a steroid taper one month ago for a lupus flare and last saw her rheumatologist one month ago.  The history is provided by the patient.    Past Medical History  Diagnosis Date  . Carpal tunnel syndrome    Past Surgical History  Procedure Laterality Date  . Total hip arthroplasty    . Tubal ligation     History reviewed. No pertinent family history. Social History  Substance Use Topics  . Smoking status: Current Every Day Smoker  . Smokeless tobacco: None  . Alcohol Use: Yes     Comment: occasional   OB History    No data available     Review of Systems  10 Systems reviewed and are negative for acute change except as noted in the HPI.   Allergies  Aleve and Tylenol  Home Medications   Prior to Admission medications   Medication Sig Start Date End Date Taking? Authorizing Provider  hydroxychloroquine (PLAQUENIL) 200 MG tablet Take 600 mg by mouth daily.   Yes Historical Provider, MD  triamcinolone cream (KENALOG) 0.1 % Apply 1 application topically 2 (two) times daily as needed (itching).   Yes Historical  Provider, MD   BP 117/40 mmHg  Pulse 95  Temp(Src) 98.5 F (36.9 C) (Oral)  Resp 18  Ht 5\' 7"  (1.702 m)  Wt 242 lb 12.8 oz (110.133 kg)  BMI 38.02 kg/m2  SpO2 99% Physical Exam  Constitutional: She is oriented to person, place, and time. She appears well-developed and well-nourished.  Uncomfortable, tearful  HENT:  Head: Normocephalic and atraumatic.  Moist mucous membranes  Eyes: Conjunctivae are normal. Pupils are equal, round, and reactive to light.  Neck: Neck supple.  Cardiovascular: Normal rate, regular rhythm and normal heart sounds.   No murmur heard. Pulmonary/Chest: Effort normal and breath sounds normal.  Abdominal: Soft. Bowel sounds are normal. She exhibits no distension. There is no tenderness.  Musculoskeletal: She exhibits no edema.  Neurological: She is alert and oriented to person, place, and time.  Fluent speech  Skin: Skin is warm.  Large areas of erythema on upper arms b/l and back; Targetoid lesions that are tender to palpation scattered on b/l lower legs  Psychiatric: She has a normal mood and affect. Judgment normal.  Nursing note and vitals reviewed.   ED Course  Procedures (including critical care time) Labs Review Labs Reviewed  BASIC METABOLIC PANEL - Abnormal; Notable for the following:    Sodium 134 (*)    All other components within normal limits  CBC WITH DIFFERENTIAL/PLATELET  PREGNANCY, URINE  URINALYSIS, ROUTINE W REFLEX  MICROSCOPIC (NOT AT [facility])     EKG Interpretation None     Medications  oxyCODONE (Oxy IR/ROXICODONE) immediate release tablet 5 mg (5 mg Oral Given 11/14/14 1741)  ondansetron (ZOFRAN-ODT) disintegrating tablet 4 mg (4 mg Oral Given 11/14/14 2049)  oxyCODONE (Oxy IR/ROXICODONE) immediate release tablet 5 mg (5 mg Oral Given 11/14/14 2049)  predniSONE (DELTASONE) tablet 20 mg (20 mg Oral Given 11/14/14 2337)    MDM   Final diagnoses:  None   44 year old female with history of lupus who presents with several days of  worsening body aches and 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 Patient tearful but nontoxic in appearance at presentation. Vital signs stable. She had several targetoid lesions concerning for SCLE lupus rash. Obtained labs to evaluate for infection as cause of lupus flare or for renal involvement. Lab work shows normal creatinine, unremarkable UA, and reassuring CBC. Gave the patient oxycodone for pain and Zofran for nausea. Based on the patient's reassuring workup, no reports of pulmonary symptoms, and no fever, I feel that she can be discharged home with a course of prednisone. I have emphasized the importance of close rheumatology follow-up and she feels confident that she can see her rheumatologist in 1 week. I have reviewed return precautions including development of fever, shortness of breath, dark urine, or any other new or alarming symptoms. Patient voiced understanding and was discharged in satisfactory condition.  Laurence Spates, MD 11/14/14 [phone]

## 2014-11-14 NOTE — ED Notes (Signed)
Phlebotomy called for blood draw.  EMT and RN unable to find appropriate vein.  Phlebotomy at bedside at this time.

## 2014-11-14 NOTE — ED Notes (Signed)
Pt aware of need for urine sample.  Pt provided with water and will attempt shortly.

## 2015-01-06 ENCOUNTER — Encounter (HOSPITAL_COMMUNITY): Payer: Self-pay | Admitting: Nurse Practitioner

## 2015-01-06 ENCOUNTER — Emergency Department (HOSPITAL_COMMUNITY)
Admission: EM | Admit: 2015-01-06 | Discharge: 2015-01-06 | Disposition: A | Discharge status: Home / Self Care | Payer: Medicare Other | Attending: Emergency Medicine | Admitting: Emergency Medicine

## 2015-01-06 DIAGNOSIS — L932 Other local lupus erythematosus: Secondary | ICD-10-CM

## 2015-01-06 DIAGNOSIS — L931 Subacute cutaneous lupus erythematosus: Secondary | ICD-10-CM | POA: Diagnosis not present

## 2015-01-06 DIAGNOSIS — F419 Anxiety disorder, unspecified: Secondary | ICD-10-CM | POA: Insufficient documentation

## 2015-01-06 DIAGNOSIS — R000 Tachycardia, unspecified: Secondary | ICD-10-CM | POA: Insufficient documentation

## 2015-01-06 DIAGNOSIS — Z8669 Personal history of other diseases of the nervous system and sense organs: Secondary | ICD-10-CM | POA: Insufficient documentation

## 2015-01-06 DIAGNOSIS — Z79899 Other long term (current) drug therapy: Secondary | ICD-10-CM | POA: Insufficient documentation

## 2015-01-06 DIAGNOSIS — E669 Obesity, unspecified: Secondary | ICD-10-CM | POA: Insufficient documentation

## 2015-01-06 DIAGNOSIS — M2550 Pain in unspecified joint: Secondary | ICD-10-CM | POA: Diagnosis present

## 2015-01-06 DIAGNOSIS — Z720 Tobacco use: Secondary | ICD-10-CM | POA: Diagnosis not present

## 2015-01-06 DIAGNOSIS — Z7952 Long term (current) use of systemic steroids: Secondary | ICD-10-CM | POA: Diagnosis not present

## 2015-01-06 DIAGNOSIS — R21 Rash and other nonspecific skin eruption: Secondary | ICD-10-CM | POA: Diagnosis not present

## 2015-01-06 HISTORY — DX: Reserved for concepts with insufficient information to code with codable children: IMO0002

## 2015-01-06 HISTORY — DX: Systemic lupus erythematosus, unspecified: M32.9

## 2015-01-06 MED ORDER — DEXAMETHASONE SODIUM PHOSPHATE 10 MG/ML IJ SOLN
10.0000 mg | Freq: Once | INTRAMUSCULAR | Status: AC
  Administered 2015-01-06: 10 mg via INTRAVENOUS
  Filled 2015-01-06: qty 1

## 2015-01-06 MED ORDER — PREDNISONE 10 MG PO TABS: ORAL_TABLET | ORAL | Status: DC

## 2015-01-06 MED ORDER — DIPHENHYDRAMINE HCL 50 MG/ML IJ SOLN
25.0000 mg | Freq: Once | INTRAMUSCULAR | Status: AC
  Administered 2015-01-06: 25 mg via INTRAVENOUS
  Filled 2015-01-06: qty 1

## 2015-01-06 MED ORDER — ONDANSETRON 4 MG PO TBDP
ORAL_TABLET | ORAL | Status: DC
Start: 2015-01-06 — End: 2015-01-06
  Filled 2015-01-06: qty 1

## 2015-01-06 MED ORDER — TRIAMCINOLONE ACETONIDE 0.1 % EX CREA
1.0000 | TOPICAL_CREAM | Freq: Two times a day (BID) | CUTANEOUS | Status: DC | PRN
Start: 2015-01-06 — End: 2015-05-12

## 2015-01-06 MED ORDER — SODIUM CHLORIDE 0.9 % IV BOLUS (SEPSIS)
1000.0000 mL | Freq: Once | INTRAVENOUS | Status: AC
Start: 2015-01-06 — End: 2015-01-06
  Administered 2015-01-06: 1000 mL via INTRAVENOUS

## 2015-01-06 MED ORDER — ONDANSETRON 4 MG PO TBDP
4.0000 mg | ORAL_TABLET | Freq: Once | ORAL | Status: AC | PRN
  Administered 2015-01-06: 4 mg via ORAL

## 2015-01-06 NOTE — Discharge Instructions (Signed)
Systemic Lupus Erythematosus, Adult  Systemic lupus erythematosus is a long-term (chronic) disease that can affect many parts of the body. It can damage the skin, joints, blood vessels, brain, kidneys, lungs, heart, and other internal organs. It causes pain, irritation, and inflammation.  Systemic lupus erythematosus is an autoimmune disease. With this type of disease, the body's defense system (immune system) mistakenly attacks normal tissues instead of attacking germs or abnormal growths.  CAUSES  The cause of this condition is not known.  RISK FACTORS  This condition is more likely to develop in:  · Females.  · People of Asian descent.  · People of African-American descent.  · People who have a family history of the condition.  SYMPTOMS  General symptoms include:  · Joint pain and swelling (common).  · Fever.  · Fatigue.  · Unusual weight loss or weight gain.  · Skin rashes, especially over the nose and cheeks (butterfly rash) and after sun exposure.  · Sores inside the mouth or nose.  Other symptoms depend on which parts of the body are affected. They can include:  · Shortness of breath.  · Chest pain.  · Frequent urination.  · Blood in the urine.  · Seizures.  · Mental changes.  · Hair loss.  · Swollen and tender lymph nodes.  · Swelling of the hands or feet.  Symptoms can come and go. A period of time when symptoms get worse or come back is called a flare. A period of time with no symptoms is called a remission.  DIAGNOSIS  This condition is diagnosed based on symptoms, a medical history, and a physical exam. You may also have tests, including:  · Blood tests.  · Urine tests.  · A chest X-ray.  · A skin or kidney biopsy. For this test, a sample of tissue is taken from the skin or kidney and studied under a microscope.  You may be referred to an autoimmune disease specialist (rheumatologist).  TREATMENT  There is no cure for this condition, but treatment can keep the disease in remission, help to control  symptoms, and prevent damage to the heart, lungs, kidneys, and other organs. Treatment may involve taking a combination of medicines over time.  HOME CARE INSTRUCTIONS  Medicines  · Take medicines only as directed by your health care provider.  · Do not take any medicines that contain estrogen without first checking with your health care provider. Estrogen can trigger flares and may increase your risk for blood clots.  Lifestyle  · Eat a heart-healthy diet.  · Stay active as directed by your health care provider.  · Do not smoke. If you need help quitting, ask your health care provider.  · Protect your skin from the sun by applying sunblock and wearing protective hats and clothing.  · Learn as much as you can about your condition and have a good support system in place. Support may come from family, friends, or a lupus support group.  General Instructions  · Keep all follow-up visits as directed by your health care provider. This is important.  · Work closely with all of your health care providers to manage your condition.  · Let your health care provider know right away if you become pregnant or if you plan to become pregnant. Pregnancy in women with this condition is considered high risk.  SEEK MEDICAL CARE IF:  · You have a fever.  · Your symptoms flare.  · You develop new symptoms.  ·   You develop swollen feet or hands.  · You develop puffiness around your eyes.  · Your medicines are not working.  · You have bloody, foamy, or coffee-colored urine.  · There are changes in your urination. For example, you urinate more often at night.  · You think that you may be depressed or have anxiety.  SEEK IMMEDIATE MEDICAL CARE IF:  · You have chest pain.  · You have trouble breathing.  · You have a seizure.  · You suddenly get a very bad headache.  · You suddenly develop facial or body weakness.  · You cannot speak.  · You cannot understand speech.     This information is not intended to replace advice given to you by your  health care provider. Make sure you discuss any questions you have with your health care provider.     Document Released: 02/14/2002 Document Revised: 07/11/2014 Document Reviewed: 02/01/2014  Elsevier Interactive Patient Education ©2016 [employer]

## 2015-01-06 NOTE — ED Notes (Addendum)
States "my lupus feels like its itching, twitching and moving all over my skin" x 2 days. states she ran out of her lupus medication last week, and took her last prednisone last night.

## 2015-01-06 NOTE — ED Notes (Signed)
Pt vomited x1.  

## 2015-01-06 NOTE — ED Provider Notes (Signed)
CSN: [id number]     Arrival date & time 01/06/15  1247 History   First MD Initiated Contact with Patient 01/06/15 1611     Chief Complaint  Patient presents with  . Lupus     (Consider location/radiation/quality/duration/timing/severity/associated sxs/prior Treatment) HPI   44 year old female with hx of Lupus presents with "lupus flare".  Pt was diagnosed with Lupus 6 months ago when she developed diffused joint pain and body rash.  She was placed on Plaquenil, prednisone, and triamcinolone cream per her rheumatologist.  She report that her cream has ran out this past week, she is about to run out of her prednisone and plaquenil.  For the past 2 days she notice worsening itchy rash with a twitching and crawling sensation throughout her skin.  Rash is worsen.  She feels anxious.  She is not scheduled to see her doctor until next month.  She denies any recent environmental changes.  Denies fever, headache, productive cough, throat swelling, abdominal cramping, n/v/d.    Past Medical History  Diagnosis Date  . Carpal tunnel syndrome   . Lupus [facility])    Past Surgical History  Procedure Laterality Date  . Total hip arthroplasty    . Tubal ligation     History reviewed. No pertinent family history. Social History  Substance Use Topics  . Smoking status: Current Every Day Smoker  . Smokeless tobacco: None  . Alcohol Use: Yes     Comment: occasional   OB History    No data available     Review of Systems  All other systems reviewed and are negative.     Allergies  Aleve and Tylenol  Home Medications   Prior to Admission medications   Medication Sig Start Date End Date Taking? Authorizing Provider  hydroxychloroquine (PLAQUENIL) 200 MG tablet Take 600 mg by mouth daily.    Historical Provider, MD  predniSONE (DELTASONE) 10 MG tablet Take 2 tablets ( ) by mouth once daily for 1 week, then take 1.5 tablets ( ) by mouth once daily for 1 week, then take 1 tablet ( ) by  mouth once daily for 1 week, then take 0.5 tablet ( ) by mouth once daily for 1 week 11/14/14   Laurence Spates, MD  triamcinolone cream (KENALOG) 0.1 % Apply 1 application topically 2 (two) times daily as needed (itching).    Historical Provider, MD   BP 100/74 mmHg  Pulse 115  Temp(Src) 98 F (36.7 C) (Oral)  Resp 16  Ht  (1.702 m)  Wt 242 lb 3.2 oz (109.861 kg)  BMI 37.92 kg/m2  SpO2 96% Physical Exam  Constitutional: She is oriented to person, place, and time. She appears well-developed and well-nourished. No distress.  AAF, obese, appears uncomfortable but non toxic  HENT:  Head: Atraumatic.  Mouth/Throat: Oropharynx is clear and moist.  Malar rash to face  Eyes: Conjunctivae and EOM are normal. Pupils are equal, round, and reactive to light.  Neck: Neck supple. No JVD present.  Cardiovascular:  Tachycardia with M/R/G  Pulmonary/Chest: Effort normal and breath sounds normal. She has no wheezes. She has no rales.  Abdominal: Soft. There is no tenderness.  Neurological: She is alert and oriented to person, place, and time.  Skin: Rash (diffused hyperpigmented blanchable erythematous rash throughout entire body without evidence of infection. ) noted.  Psychiatric: She has a normal mood and affect.  Nursing note and vitals reviewed.   ED Course  Procedures (including critical care time)  4:31 PM Pt  with Lupus rash, with worsening sxs due to running out of her Lupus medication.  She appears uncomfortable to due the pruritus nature of the rash however no airway compromise and rash does not appears infectious.  Plan to provide symptomatic care.  Pt will need to f/u promptly with her rheumatologist for further management.   6:36 PM Vital sign normalized.  Pt felt better  Will d/c with refill of steroid and steroid cream.  Pt agrees to f/u promptly with her rheumatoogist for further care.  Care discussed with Dr. Jeraldine LootsLockwood.    MDM   Final diagnoses:  Cutaneous lupus  erythematosus    BP 128/69 mmHg  Pulse 94  Temp(Src) 98 F (36.7 C) (Oral)  Resp 16  Ht 5\' 7"  (1.702 m)  Wt 242 lb 3.2 oz (109.861 kg)  BMI 37.92 kg/m2  SpO2 99%     Fayrene HelperBowie Gina Leblond, PA-C 01/06/15 1836  Gerhard Munchobert Lockwood, MD 01/06/15 2359

## 2015-05-12 ENCOUNTER — Emergency Department (HOSPITAL_COMMUNITY): Payer: Medicare Other

## 2015-05-12 ENCOUNTER — Emergency Department (HOSPITAL_COMMUNITY)
Admission: EM | Admit: 2015-05-12 | Discharge: 2015-05-12 | Disposition: A | Discharge status: Home / Self Care | Payer: Medicare Other | Attending: Emergency Medicine | Admitting: Emergency Medicine

## 2015-05-12 ENCOUNTER — Encounter (HOSPITAL_COMMUNITY): Payer: Self-pay | Admitting: Emergency Medicine

## 2015-05-12 DIAGNOSIS — M329 Systemic lupus erythematosus, unspecified: Secondary | ICD-10-CM | POA: Diagnosis not present

## 2015-05-12 DIAGNOSIS — Z87891 Personal history of nicotine dependence: Secondary | ICD-10-CM | POA: Insufficient documentation

## 2015-05-12 DIAGNOSIS — R21 Rash and other nonspecific skin eruption: Secondary | ICD-10-CM | POA: Diagnosis not present

## 2015-05-12 DIAGNOSIS — Z79899 Other long term (current) drug therapy: Secondary | ICD-10-CM | POA: Diagnosis not present

## 2015-05-12 DIAGNOSIS — J209 Acute bronchitis, unspecified: Secondary | ICD-10-CM | POA: Diagnosis not present

## 2015-05-12 DIAGNOSIS — M1990 Unspecified osteoarthritis, unspecified site: Secondary | ICD-10-CM | POA: Diagnosis not present

## 2015-05-12 DIAGNOSIS — R05 Cough: Secondary | ICD-10-CM | POA: Diagnosis present

## 2015-05-12 DIAGNOSIS — J40 Bronchitis, not specified as acute or chronic: Secondary | ICD-10-CM

## 2015-05-12 MED ORDER — PREDNISONE 10 MG PO TABS: ORAL_TABLET | ORAL | Status: DC

## 2015-05-12 MED ORDER — HYDROCODONE-ACETAMINOPHEN 5-325 MG PO TABS
1.0000 | ORAL_TABLET | Freq: Once | ORAL | Status: AC
  Administered 2015-05-12: 1 via ORAL
  Filled 2015-05-12: qty 1

## 2015-05-12 MED ORDER — ALBUTEROL SULFATE HFA 108 (90 BASE) MCG/ACT IN AERS
2.0000 | INHALATION_SPRAY | RESPIRATORY_TRACT | Status: DC | PRN
  Filled 2015-05-12: qty 6.7

## 2015-05-12 MED ORDER — PREDNISONE 20 MG PO TABS
60.0000 mg | ORAL_TABLET | Freq: Once | ORAL | Status: AC
  Administered 2015-05-12: 60 mg via ORAL
  Filled 2015-05-12: qty 3

## 2015-05-12 MED ORDER — MORPHINE SULFATE (PF) 4 MG/ML IV SOLN
6.0000 mg | Freq: Once | INTRAVENOUS | Status: AC
  Administered 2015-05-12: 6 mg via INTRAVENOUS
  Filled 2015-05-12: qty 2

## 2015-05-12 MED ORDER — BENZONATATE 100 MG PO CAPS: 100.0000 mg | ORAL_CAPSULE | Freq: Three times a day (TID) | ORAL | Status: DC

## 2015-05-12 MED ORDER — SODIUM CHLORIDE 0.9 % IV SOLN
1000.0000 mL | Freq: Once | INTRAVENOUS | Status: AC
  Administered 2015-05-12: 1000 mL via INTRAVENOUS

## 2015-05-12 MED ORDER — SODIUM CHLORIDE 0.9 % IV SOLN: 1000.0000 mL | Freq: Once | INTRAVENOUS | Status: DC

## 2015-05-12 MED ORDER — AZITHROMYCIN 250 MG PO TABS
1000.0000 mg | ORAL_TABLET | Freq: Once | ORAL | Status: AC
  Administered 2015-05-12: 1000 mg via ORAL
  Filled 2015-05-12: qty 4

## 2015-05-12 MED ORDER — ONDANSETRON HCL 4 MG/2ML IJ SOLN
4.0000 mg | Freq: Once | INTRAMUSCULAR | Status: AC
  Administered 2015-05-12: 4 mg via INTRAVENOUS
  Filled 2015-05-12: qty 2

## 2015-05-12 MED ORDER — SODIUM CHLORIDE 0.9 % IV SOLN: 1000.0000 mL | INTRAVENOUS | Status: DC

## 2015-05-12 MED ORDER — TRIAMCINOLONE ACETONIDE 0.1 % EX CREA: 1.0000 "application " | TOPICAL_CREAM | Freq: Two times a day (BID) | CUTANEOUS | Status: DC | PRN

## 2015-05-12 MED ORDER — IPRATROPIUM BROMIDE 0.02 % IN SOLN
0.5000 mg | Freq: Once | RESPIRATORY_TRACT | Status: AC
Start: 2015-05-12 — End: 2015-05-12
  Administered 2015-05-12: 0.5 mg via RESPIRATORY_TRACT
  Filled 2015-05-12: qty 2.5

## 2015-05-12 MED ORDER — AZITHROMYCIN 250 MG PO TABS: 250.0000 mg | ORAL_TABLET | Freq: Every day | ORAL | Status: DC

## 2015-05-12 MED ORDER — ALBUTEROL SULFATE (2.5 MG/3ML) 0.083% IN NEBU
5.0000 mg | INHALATION_SOLUTION | Freq: Once | RESPIRATORY_TRACT | Status: AC
  Administered 2015-05-12: 5 mg via RESPIRATORY_TRACT
  Filled 2015-05-12: qty 6

## 2015-05-12 NOTE — ED Provider Notes (Signed)
CSN: [id number]     Arrival date & time 05/12/15  1157 History   First MD Initiated Contact with Patient 05/12/15 1242     Chief Complaint  Patient presents with  . Lupus  . URI  . Shoulder Pain     (Consider location/radiation/quality/duration/timing/severity/associated sxs/prior Treatment) HPI   45 year old female with history of lupus who presents with multiple complaints.  Patient had cold symptoms for the past week. Symptoms including generalized body aches, nasal congestion, sneezing, coughing, cough productive with yellow sputum, not improved with taking NyQuil. Furthermore, she has been complaining of bilateral shoulder pain for the past several months right greater than left. Pain is waxing waning but never fully resolved. She attributed her pain to her lupus. She however does not have any Lupus medication due to lack of primary care provider. She does not have any history of asthma but has been wheezing. She has a lupus rash that has not changed. She also endorsed having nausea vomiting and diarrhea for the past 4 days. Vomiting is nonbloody nonbilious, and diarrhea without blood.  Past Medical History  Diagnosis Date  . Carpal tunnel syndrome   . Lupus [facility](HCC)    Past Surgical History  Procedure Laterality Date  . Total hip arthroplasty    . Tubal ligation     No family history on file. Social History  Substance Use Topics  . Smoking status: Former Smoker    Quit date: 04/28/2015  . Smokeless tobacco: None  . Alcohol Use: Yes     Comment: occasional   OB History    No data available     Review of Systems  All other systems reviewed and are negative.     Allergies  Aleve  Home Medications   Prior to Admission medications   Medication Sig Start Date End Date Taking? Authorizing Provider  hydroxychloroquine (PLAQUENIL) 200 MG tablet Take 600 mg by mouth daily.    Historical Provider, MD  predniSONE (DELTASONE) 10 MG tablet Take 2 tablets (20mg ) by mouth once  daily for 1 week, then take 1.5 tablets (15mg ) by mouth once daily for 1 week, then take 1 tablet (10mg ) by mouth once daily for 1 week, then take 0.5 tablet (5mg ) by mouth once daily for 1 week 01/06/15   Fayrene HelperBowie Omega Slager, PA-C  triamcinolone cream (KENALOG) 0.1 % Apply 1 application topically 2 (two) times daily as needed (itching). 01/06/15   Fayrene HelperBowie Avion Patella, PA-C   BP 121/64 mmHg  Pulse 92  Temp(Src) 97.8 F (36.6 C)  Resp 18  Ht 5\' 7"  (1.702 m)  Wt 108.954 kg  BMI 37.61 kg/m2  SpO2 96%  LMP 04/30/2015 (Approximate) Physical Exam  Constitutional: She appears well-developed and well-nourished. No distress.  Obese African-American female appears tired and is having some trouble breathing.  HENT:  Head: Atraumatic.  Mouth/Throat: Oropharynx is clear and moist.  Eyes: Conjunctivae are normal.  Neck: Neck supple.  Cardiovascular: Normal rate and regular rhythm.   Pulmonary/Chest: She has wheezes (Expiratory wheezes with rhonchi is heard throughout. Decreased breath sounds.).  Abdominal: Soft. She exhibits no distension. There is no tenderness.  Neurological: She is alert.  Skin: Rash (Hyperpigmented rash noted throughout body, chronic in nature.) noted.  Psychiatric: She has a normal mood and affect.  Nursing note and vitals reviewed.   ED Course  Procedures (including critical care time) Labs Review Labs Reviewed - No data to display  Imaging Review Dg Chest 2 View  05/12/2015  CLINICAL DATA:  Productive cough.  Wheezing and shortness of breath. EXAM: CHEST  2 VIEW COMPARISON:  Jul 29, 2009 FINDINGS: Streaky opacity again seen in left mid lung. The cardiomediastinal silhouette is stable given patient rotation. Mild prominence of right hilum is likely due to patient rotation. The heart size remains borderline. Increased interstitial markings are seen bilaterally, a little more prominent in the interval. IMPRESSION: 1. Increased interstitial markings bilaterally, increased in the interval.  While there is probably a chronic component, increased prominence suggests an acute on chronic process such as atypical pneumonia or less likely edema. Recommend clinical correlation and attention on follow-up. Electronically Signed   By: Gerome Sam III M.D   On: 05/12/2015 14:29   I have personally reviewed and evaluated these images and lab results as part of my medical decision-making.   EKG Interpretation None      MDM   Final diagnoses:  Bronchitis  Lupus arthritis (HCC)    BP 113/69 mmHg  Pulse 89  Temp(Src) 97.8 F (36.6 C)  Resp 18  Ht  (1.702 m)  Wt 108.954 kg  BMI 37.61 kg/m2  SpO2 95%  LMP 04/30/2015 (Approximate)   1:49 PM Patient here with URI symptoms however she is actively wheezing having mild respiratory discomfort. Albuterol Atrovent and prednisone given. She has history of lupus affecting her joints specifically the shoulders, this is chronic in nature and she is currently not on any Lupus medication oral prednisone. She also having nausea vomiting diarrhea past several days concern for dehydration. IV fluid given.  3:42 PM Chest x-ray demonstrate increased interstitial marking which may suggests acute on chronic edema or atypical pneumonia. Since patient report productive cough, having wheezing with rhonchis, patient will benefit from Zithromax, prednisone, albuterol inhaler, and cough medication. She did receive DuoNeb's in the ER and felt better after initial treatment. She currently hemodynamically stable, no fever, tachypnea, tachycardia, or hypoxia.  O2 sats stay above 90% on RA while ambulating.   i recommend f/u with PCP for further management of her health.  I provide outpt resources to use as needed.  Return precaution discussed.  Pt voice understanding and agrees with plan.    Fayrene Helper, PA-C 05/12/15 1601  Tilden Fossa, MD 05/12/15 [phone]

## 2015-05-12 NOTE — ED Notes (Signed)
Brought patient back to room; patient getting undressed and into a gown at this time 

## 2015-05-12 NOTE — Discharge Instructions (Signed)
You have evidence of bronchitis.  Take steroid, cough medication, and zithromax as treatment.  Use albuterol inhaler 2 puffs every 4 hrs as need for wheezing or shortness of breath.  Acute Bronchitis Bronchitis is inflammation of the airways that extend from the windpipe into the lungs (bronchi). The inflammation often causes mucus to develop. This leads to a cough, which is the most common symptom of bronchitis.  In acute bronchitis, the condition usually develops suddenly and goes away over time, usually in a couple weeks. Smoking, allergies, and asthma can make bronchitis worse. Repeated episodes of bronchitis may cause further lung problems.  CAUSES Acute bronchitis is most often caused by the same virus that causes a cold. The virus can spread from person to person (contagious) through coughing, sneezing, and touching contaminated objects. SIGNS AND SYMPTOMS   Cough.   Fever.   Coughing up mucus.   Body aches.   Chest congestion.   Chills.   Shortness of breath.   Sore throat.  DIAGNOSIS  Acute bronchitis is usually diagnosed through a physical exam. Your health care provider will also ask you questions about your medical history. Tests, such as chest X-rays, are sometimes done to rule out other conditions.  TREATMENT  Acute bronchitis usually goes away in a couple weeks. Oftentimes, no medical treatment is necessary. Medicines are sometimes given for relief of fever or cough. Antibiotic medicines are usually not needed but may be prescribed in certain situations. In some cases, an inhaler may be recommended to help reduce shortness of breath and control the cough. A cool mist vaporizer may also be used to help thin bronchial secretions and make it easier to clear the chest.  HOME CARE INSTRUCTIONS  Get plenty of rest.   Drink enough fluids to keep your urine clear or pale yellow (unless you have a medical condition that requires fluid restriction). Increasing fluids may  help thin your respiratory secretions (sputum) and reduce chest congestion, and it will prevent dehydration.   Take medicines only as directed by your health care provider.  If you were prescribed an antibiotic medicine, finish it all even if you start to feel better.  Avoid smoking and secondhand smoke. Exposure to cigarette smoke or irritating chemicals will make bronchitis worse. If you are a smoker, consider using nicotine gum or skin patches to help control withdrawal symptoms. Quitting smoking will help your lungs heal faster.   Reduce the chances of another bout of acute bronchitis by washing your hands frequently, avoiding people with cold symptoms, and trying not to touch your hands to your mouth, nose, or eyes.   Keep all follow-up visits as directed by your health care provider.  SEEK MEDICAL CARE IF: Your symptoms do not improve after 1 week of treatment.  SEEK IMMEDIATE MEDICAL CARE IF:  You develop an increased fever or chills.   You have chest pain.   You have severe shortness of breath.  You have bloody sputum.   You develop dehydration.  You faint or repeatedly feel like you are going to pass out.  You develop repeated vomiting.  You develop a severe headache. MAKE SURE YOU:   Understand these instructions.  Will watch your condition.  Will get help right away if you are not doing well or get worse.   This information is not intended to replace advice given to you by your health care provider. Make sure you discuss any questions you have with your health care provider.   Document Released: 04/03/2004  Document Revised: 03/17/2014 Document Reviewed: 08/17/2012 Elsevier Interactive Patient Education 2016 [employer]  [employer] Resource Guide Financial Assistance The United Ways 211 is a great source of information about community services available.  Access by dialing 2-1-1 from anywhere in [place], or by website -  PooledIncome.plwww.nc211.org.    Other Local Resources (Updated 03/2015)  Financial Assistance   Services    Phone Number and Address  [facility]  Low-cost medical care - 1st and 3rd Saturday of every month  Must not qualify for public or private insurance and must have limited income [phone] [address] [place], [place]    [place] Department of Social Services  Child care  Emergency assistance for housing and Kimberly-Clarkutilities  Food stamps  Medicaid [phone] [address] [place], [place] [address]   [facility]  Low-cost medical care for children, communicable diseases, sexually-transmitted diseases, immunizations, maternity care, womens health and family planning [phone] [address] [place], [place] [address]  [facility]   Medication assistance for [place] residents  Must meet income requirements [phone] [address] [place], [place].    [facility]  Child care  Emergency assistance for housing and Kimberly-Clarkutilities  Food stamps  Medicaid [phone] [address] [place], [place] [address]  [facility]   Low-cost medical care,   Monday through Friday, 9 am to 6 pm.   Accepts Medicare/Medicaid, and self-pay [phone] [address] [place], [place] [address]  [facility]  Low-cost medical care - Monday through Friday, 8:30 am - 5:30 pm  Accepts Medicaid and self-pay [phone] [address] [place], [place] [address]   [facility]  Primary medical care, including for those with sickle cell disease  Accepts Medicare, Medicaid, insurance and self-pay [phone] [address] [place], [place]  [facility]   Primary medical care  Accepts Medicare, [place], insurance and self-pay [phone] [address], [address] [place], [place] [address]   [facility] Department of Social Services  Child care  Emergency assistance for housing and Kimberly-Clarkutilities  Food stamps  Medicaid [phone] [address] [place], [place] [address]  [facility] [place] Department of Health and [employer]Human Services  Child care  Emergency assistance for housing and Kimberly-Clarkutilities  Food stamps  Medicaid [phone] [address] [place], [place] [address]   [facility]  Medication assistance for [facility] [place] residents with no insurance only  Must have a primary care doctor [phone] [address], [address] [place], [place]  [facility]   Primary medical care  Do[place]Accepts Medicare, [place]icaid, insurance  [phone] [address] [place], [place]  Med[facility]ist   Medication assistance [phone]  [facility]   Primary medical care  Accepts Medicare, [place], insurance and self-pay [phone] [address] [place], [place] [address]  [facility]   Primary medical care  Accepts Medicare, [place], insurance and self-pay [phone] [address] [place], [place] [address]  [facility]  For [place] residents between the ages of 6018 and 3064 who do not have any form of health insurance, Medicare, [place], or [place] benefits.  Services are provided free of charge to uninsured patients who fall within federal poverty guidelines.    Hours: Tuesdays and Thursdays, 4:15 - 8 pm [phone] [address], [address] [place],  [place] [address]  [facility]     Primary medical care  Dental care  Nutritional counseling  Pharmacy  Accepts Medicaid, Medicare, most insurance.  Fees are adjusted based on ability to pay.   [phone] [facility] [address] [place], [place]  [phone] [facility] [address] [place], [place]  [phone] [facility] [place], [place]  [phone] [facility], [address] [place], [place]  [phone] [facility] [address] [place], [place]  [facility]  Womens health and family planning [phone] [address] [place], [place]  [facility] Department of Social Services  Child care  Emergency assistance for housing and [employer]  Medicaid [phone], [place], [place] [address]   [facility]    Ages 21 and older  Hours: Mondays and Thursdays, 7:00 am - 9:00 am Patients are seen on a first come, first served basis. [phone], [phone] [place], [place]  [facility] Division of Social Services  Child care  Emergency assistance for housing and [employer]  Medicaid [phone] [address] [place], [place] [address]  [facility]  Medication assistance  Rental assistance  Food pantry  Medication assistance  Housing assistance  Emergency food distribution  Utility assistance [phone] [address] [place], [place]  [phone]  [address] [place], [place] [address] Hours: Tuesdays and Thursdays from 9am - 12 noon by appointment only  [phone] [address] [place], [place] [address]  [facility] - Lanae Boast   Accepts private insurance, [place], and [place].  Payment is based on a sliding scale for those without insurance.  Hours: Mondays, Tuesdays and Thursdays, 8:30 am - 5:30 pm.   [phone] [address] [place], [place]  [facility] - Family Medicine at [facility], [place], and [place].  Payment is based on a sliding scale for those without insurance. [phone] [address] [place], [place]  [facility] -  Pediatrics at E. Scientist, research (physical sciences), [employer], and [place].  Payment is based on a sliding scale for those without insurance [phone] [address], [employer], [place]  [facility] - Pediatrics at [employer], [place], and [place].  Payment is based on a sliding scale for those without insurance. [phone] [address] [place], [place]  [facility] - Pediatrics at [facility], [place], and [place].  Payment is based on a sliding scale for those without insurance. [phone], [phone] [place], [place].    [facility]  Maternity care.  Accepts Medicaid and self-pay. [phone] [address] [place], [place]

## 2015-05-12 NOTE — ED Notes (Signed)
Patient transported to X-ray 

## 2015-05-12 NOTE — ED Notes (Signed)
Patient on continuous pulse oximetry and blood pressure cuff 

## 2015-05-12 NOTE — ED Notes (Signed)
Pt c/o cold symptoms x 1 week, lupus flare up x 2 weeks and shoulder pain for couple months.

## 2015-05-12 NOTE — ED Notes (Signed)
Ambulated patient in hall, pt maintained O2 between 91-95%.

## 2015-06-08 DIAGNOSIS — F17210 Nicotine dependence, cigarettes, uncomplicated: Secondary | ICD-10-CM | POA: Diagnosis present

## 2015-06-08 DIAGNOSIS — M329 Systemic lupus erythematosus, unspecified: Secondary | ICD-10-CM | POA: Diagnosis present

## 2015-07-12 ENCOUNTER — Ambulatory Visit (INDEPENDENT_AMBULATORY_CARE_PROVIDER_SITE_OTHER): Payer: Medicare Other | Admitting: Pulmonary Disease

## 2015-07-12 ENCOUNTER — Encounter: Payer: Self-pay | Admitting: Pulmonary Disease

## 2015-07-12 VITALS — BP 118/74 | HR 73 | Ht 66.0 in | Wt 239.0 lb

## 2015-07-12 DIAGNOSIS — J189 Pneumonia, unspecified organism: Secondary | ICD-10-CM | POA: Diagnosis not present

## 2015-07-12 MED ORDER — PREDNISONE 10 MG PO TABS: 40.0000 mg | ORAL_TABLET | Freq: Every day | ORAL | Status: DC

## 2015-07-12 MED ORDER — TRAMADOL HCL 50 MG PO TABS: 50.0000 mg | ORAL_TABLET | Freq: Three times a day (TID) | ORAL | Status: DC | PRN

## 2015-07-12 MED ORDER — HYDROXYCHLOROQUINE SULFATE 200 MG PO TABS: 600.0000 mg | ORAL_TABLET | Freq: Every day | ORAL | Status: DC

## 2015-07-12 NOTE — Progress Notes (Signed)
Subjective:    Patient ID: Lisa Foley, female    DOB: 28-Apr-1970, 45 y.o.   MRN: [id number]  HPI Consult for evaluation of bronchitis, pneumonia.  Lisa Foley is a 45 year old with past medical history of lupus. She was diagnosed about a year ago by Dr. Kellie Simmeringruslow and treated briefly with prednisone and Plaquenil. However she has stopped follow-up due to insurance reasons and is not on treatment anymore. She was seen several times in the emergency room for her SLE flare. Manifestations are mainly skin rash, shoulder joint pain and she had been intermittently on steroids given to her in the emergency rooms.  She was seen in the emergency room in March 2017 for increased dyspnea. A chest x-ray at that time revealed bilateral infiltrates. She was treated with Zithromax prednisone and albuterol and cough medication. She's had a follow-up CT on 06/19/15 which showed bilateral groundglass infiltrate and a repeat CT yesterday. I don't have the images of these to review. She was seen by her primary care doctor and a follow-up arranged [place] later this month.  She feels better with respect to her respiratory symptoms but is in considerable joint pain and distress in the office today.   Social history: She smokes half pack per day, no alcohol or drug use. 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  Family history: Father-cancer Brother-hypertension Mother-hypertension Sister-lupus  Past Medical History  Diagnosis Date  . Carpal tunnel syndrome   . Lupus (HCC)     Current outpatient prescriptions:  .  acetaminophen (TYLENOL) 500 MG tablet, Take 1,000 mg by mouth every 6 (six) hours as needed for mild pain., Disp: , Rfl:  .  budesonide-formoterol (SYMBICORT) 160-4.5 MCG/ACT inhaler, Inhale 2 puffs into the lungs 2 (two) times daily., Disp: , Rfl:  .  hydroxychloroquine (PLAQUENIL) 200 MG tablet, Take 600 mg by mouth daily., Disp: , Rfl:  .  triamcinolone cream  (KENALOG) 0.1 %, Apply 1 application topically 2 (two) times daily as needed (itching)., Disp: 30 g, Rfl: 0 .  azithromycin (ZITHROMAX) 250 MG tablet, Take 1 tablet (250 mg total) by mouth daily. (Patient not taking: Reported on 07/12/2015), Disp: 4 tablet, Rfl: 0 .  benzonatate (TESSALON) 100 MG capsule, Take 1 capsule (100 mg total) by mouth every 8 (eight) hours. (Patient not taking: Reported on 07/12/2015), Disp: 21 capsule, Rfl: 0  Review of Systems Denies any cough, sputum production, wheezing, dyspnea, hemoptysis. Bilateral shoulder pain, skin rash, joint stiffness, tenderness. No chest pain, palpitation. No nausea, vomiting, diarrhea, constipation. No fevers, chills, diarrhea, constipation. All other review of systems are negative    Objective:   Physical Exam Blood pressure 118/74, pulse 73, height 5\' 6"  (1.676 m), weight 239 lb (108.41 kg), SpO2 100 %. Gen: Moderate distress distress from joint pain, obese Neuro: No gross focal deficits. HEENT: No JVD, lymphadenopathy, thyromegaly. RS: Clear, No wheeze or crackles CVS: S1-S2 heard, no murmurs rubs gallops. Abdomen: Soft, positive bowel sounds. Musculoskeletal: No edema.    Assessment & Plan:  #1 Recent pneumonia, bronchitis. She was treated with antibiotics and prednisone with apparent improvement in symptoms. She had a follow-up CT scan done last month which shows groundglass infiltrates. I'm not sure if this is a sequelae of her prior infection or if the lupus is affecting the lung. She had a repeat CT scan yesterday and I don't have these images to review. She will drop off the CD with images in the office tomorrow.  She'll need  a complete assessment with pulmonary function tests, echocardiogram in the future. I will hold off on this until her lupus has been treated again and under control.  #2 Lupus flare Her lupus has been poorly controlled since she stopped follow-up with rheumatology and been off treatment. She has a  follow-up arranged for later this month but she seems to be in considerable distress from joint pain. I will give her a prescription for prednisone 40 mg, Plaquenil and tramadol to help with the symptoms until she is able to make this appointment with rheumatology.  Plan: - Review CT images done yesterday. Pt to drop off the CD - Prescribe prednisone, plaquinel and tramadol - Rheumatology follow up.  Return in 1 month.  Chilton Greathouse MD [facility] Pager [phone] If no answer or after 3pm call: [phone] 07/12/2015, 1:10 PM

## 2015-07-12 NOTE — Addendum Note (Signed)
Addended by: Boone MasterJONES, Ethyle Tiedt E on: 07/12/2015 02:46 PM   Modules accepted: Orders

## 2015-07-12 NOTE — Patient Instructions (Addendum)
We started on prednisone 40 mg a day and Plaquenil 600 mg daily. We will also give you a prescription for tramadol 50 mg tid a day when necessary for pain. Please continue this medication until you as her able to follow up with the rheumatologist.  Please drop off the CT scan images and discs that he have for review.  Return to clinic in 1 month.

## 2015-07-17 ENCOUNTER — Telehealth: Payer: Self-pay | Admitting: Pulmonary Disease

## 2015-07-17 NOTE — Telephone Encounter (Addendum)
Pt dropped off the imaging done at [facility]. I reviewed the images on CT scan 06/19/15 and repeat CXR from 07/10/15. There is increase of the lung infiltrates on the latest CXR.   DDX includes persisting infection or Lupus affecting the lungs I believe she needs a bronch with BAL, biopsy for further eval and another course of abx. We scheduled a bronch for 5/10 but have been unable to get in touch with Ms Lisa BradleyBarr inspite of  multiple phone calls. I left a voice mail requesting call back and will have to reschedule the procedure after we hear back from her.   Chilton GreathousePraveen Berlie Persky MD  Pulmonary and Critical Care Pager [phone] If no answer or after 3pm call: [phone] 07/17/2015, 10:27 AM

## 2015-07-18 ENCOUNTER — Encounter (HOSPITAL_COMMUNITY): Payer: Medicare Other

## 2015-07-18 ENCOUNTER — Encounter (HOSPITAL_COMMUNITY): Admission: RE | Payer: Self-pay | Source: Ambulatory Visit

## 2015-07-18 ENCOUNTER — Ambulatory Visit (HOSPITAL_COMMUNITY): Admission: RE | Admit: 2015-07-18 | Payer: Medicare Other | Source: Ambulatory Visit | Admitting: Pulmonary Disease

## 2015-07-18 SURGERY — BRONCHOSCOPY, WITH FLUOROSCOPY
Anesthesia: Moderate Sedation | Laterality: Bilateral

## 2015-07-20 NOTE — Telephone Encounter (Signed)
LMOM TCB x2

## 2015-07-24 NOTE — Telephone Encounter (Signed)
LMOM TCB x3  Will close message and send letter to pt

## 2015-07-26 ENCOUNTER — Institutional Professional Consult (permissible substitution): Payer: Medicare Other | Admitting: Internal Medicine

## 2015-08-01 ENCOUNTER — Telehealth: Payer: Self-pay | Admitting: Pulmonary Disease

## 2015-08-01 NOTE — Telephone Encounter (Signed)
ATC pt at number proved - went directly to VM. lmomtcb for pt

## 2015-08-02 NOTE — Telephone Encounter (Signed)
Call Documentation      Lowell BoutonJessica E Jones, CMA at 07/24/2015 1:34 PM     Status: Signed       Expand All Collapse All   LMOM TCB x3  Will close message and send letter to pt            Lowell BoutonJessica E Jones, CMA at 07/20/2015 4:39 PM     Status: Signed       Expand All Collapse All   LMOM TCB x2            Chilton GreathousePraveen Mannam, MD at 07/17/2015 10:17 AM     Status: Addendum       Expand All Collapse All   Pt dropped off the [facility]. I reviewed the images on CT scan 06/19/15 and repeat CXR from 07/10/15. There is increase of the lung infiltrates on the latest CXR.   DDX includes persisting infection or Lupus affecting the lungs I believe she needs a bronch with BAL, biopsy for further eval and another course of abx. We scheduled a bronch for 5/10 but have been unable to get in touch with Ms Lisa BradleyBarr inspite of multiple phone calls. I left a voice mail requesting call back and will have to reschedule the procedure after we hear back from her.   Chilton GreathousePraveen Mannam MD [facility] Pager [phone] If no answer or after 3pm call: [phone] 07/17/2015, 10:27 AM       lmtcb x2 for pt

## 2015-08-03 NOTE — Telephone Encounter (Signed)
Spoke with pt. She is aware that we need to reschedule her bronch.  PM - please advise when would be a good time to reschedule this. Thanks.

## 2015-08-09 NOTE — Telephone Encounter (Signed)
No reply from PM - [facility] for follow up as he is in office today.

## 2015-08-10 NOTE — Telephone Encounter (Signed)
LM x 1 for pt 

## 2015-08-10 NOTE — Telephone Encounter (Signed)
Per PM: Tues or Wed afternoon for bronch w/ floro.  Please make sure pt has cxr, CBCD and coags prior to procedure.  Thank you.

## 2015-08-10 NOTE — Telephone Encounter (Signed)
PM please advise on when bronch can be rescheduled.  Thanks!

## 2015-08-10 NOTE — Telephone Encounter (Signed)
Next week Mon to Wed. Afternoons preferred for bronch with fluro. Please schedule for repeat CXR, coags and CBC before procedure.

## 2015-08-13 NOTE — Telephone Encounter (Signed)
Attempted to call pt. Received recording, "the caller is not accepting calls at this time. Please try your call again later." Will try back.

## 2015-08-14 NOTE — Telephone Encounter (Signed)
We will not try to contact pt again about bronch. This will addressed with her at her upcoming appointment. Labs and CXR can be done on the day of appointment.

## 2015-08-14 NOTE — Telephone Encounter (Signed)
BMP, CBC with diff, PTT, PT, INR and a repeat CXR. Can we get these when she returns for repeat appointment on 6/9. We will need to reassess before scheduling for a bronch.

## 2015-08-14 NOTE — Telephone Encounter (Signed)
Dr Mannam pleasIsaiah Sergee advise on what all labs you want to check other than CBCD.  Do you want just a PT/INR or are there other coag labs you want drawn?  We have not been able to reach the patient yet to schedule this procedure. Please advise Dr Isaiah SergeMannam. Thanks.

## 2015-08-17 ENCOUNTER — Ambulatory Visit: Payer: Medicare Other | Admitting: Pulmonary Disease

## 2015-09-04 ENCOUNTER — Other Ambulatory Visit: Payer: Self-pay | Admitting: Pulmonary Disease

## 2017-03-23 ENCOUNTER — Emergency Department (HOSPITAL_COMMUNITY): Payer: Medicare Other

## 2017-03-23 ENCOUNTER — Encounter (HOSPITAL_COMMUNITY): Payer: Self-pay

## 2017-03-23 ENCOUNTER — Emergency Department (HOSPITAL_COMMUNITY)
Admission: EM | Admit: 2017-03-23 | Discharge: 2017-03-23 | Disposition: A | Discharge status: Home / Self Care | Payer: Medicare Other | Attending: Emergency Medicine | Admitting: Emergency Medicine

## 2017-03-23 ENCOUNTER — Other Ambulatory Visit: Payer: Self-pay

## 2017-03-23 DIAGNOSIS — M545 Low back pain, unspecified: Secondary | ICD-10-CM

## 2017-03-23 DIAGNOSIS — Z96649 Presence of unspecified artificial hip joint: Secondary | ICD-10-CM | POA: Insufficient documentation

## 2017-03-23 DIAGNOSIS — M25551 Pain in right hip: Secondary | ICD-10-CM | POA: Diagnosis present

## 2017-03-23 DIAGNOSIS — Z79899 Other long term (current) drug therapy: Secondary | ICD-10-CM | POA: Diagnosis not present

## 2017-03-23 DIAGNOSIS — F17210 Nicotine dependence, cigarettes, uncomplicated: Secondary | ICD-10-CM | POA: Diagnosis not present

## 2017-03-23 LAB — POC URINE PREG, ED: Preg Test, Ur: NEGATIVE

## 2017-03-23 MED ORDER — PREDNISONE 10 MG (21) PO TBPK: ORAL_TABLET | Freq: Every day | ORAL | 0 refills | Status: DC

## 2017-03-23 MED ORDER — OXYCODONE-ACETAMINOPHEN 5-325 MG PO TABS: 1.0000 | ORAL_TABLET | Freq: Four times a day (QID) | ORAL | 0 refills | Status: DC | PRN

## 2017-03-23 MED ORDER — OXYCODONE-ACETAMINOPHEN 5-325 MG PO TABS
2.0000 | ORAL_TABLET | Freq: Once | ORAL | Status: AC
  Administered 2017-03-23: 2 via ORAL
  Filled 2017-03-23: qty 2

## 2017-03-23 NOTE — Discharge Instructions (Signed)
Medications: Prednisone, Percocet  Treatment: Take prednisone as prescribed for the next 12 days.  Take 1-2 Percocet every 6 hours as needed for severe pain.  Attempt the exercises and stretches daily as tolerated.  Use a heating pad 3-4 times daily alternating 15 minutes on, 15 minutes off.  You can also alternate with an ice pack.  Do not drink alcohol, drive, operate machinery or participate in any other potentially dangerous activities while taking opiate pain medication as it may make you sleepy. Do not take this medication with any other sedating medications, either prescription or over-the-counter. If you were prescribed Percocet or Vicodin, do not take these with acetaminophen (Tylenol) as it is already contained within these medications and overdose of Tylenol is dangerous.   This medication is an opiate (or narcotic) pain medication and can be habit forming.  Use it as little as possible to achieve adequate pain control.  Do not use or use it with extreme caution if you have a history of opiate abuse or dependence. This medication is intended for your use only - do not give any to anyone else and keep it in a secure place where nobody else, especially children, have access to it. It will also cause or worsen constipation, so you may want to consider taking an over-the-counter stool softener while you are taking this medication.  Follow-up: Please follow-up with your doctor for further evaluation and treatment of your back pain.  Attached is a rheumatologist in [place] for you to establish care.  Please call their office to make an appointment.  Please return to the emergency department if you develop any loss of bowel or bladder control, complete numbness of your groin or legs.

## 2017-03-23 NOTE — ED Provider Notes (Signed)
[facility] Provider Note   CSN: [id number] Arrival date & time: 03/23/17  1127     History   Chief Complaint Chief Complaint  Patient presents with  . Hip Pain    HPI Lisa Foley is a 47 y.o. female with history of lupus, right hip replacement in 2011 who presents with intermittent, worsening right hip pain over the past few weeks.  She has had some right low back pain as well.  She denies any numbness or tingling.  She denies any fever, history of IVDU, recent surgery or injection, known cancer, saddle anesthesia, bowel or bladder incontinence.  She has taken ibuprofen at home without relief, and has had associated swelling because of it.  She has not taken her prednisone for her lupus in a while.  She has not seen a rheumatologist in over a year and a half.  Patient also reports intermittent bilateral shoulder pain for several months as well as intermittent left hip pain.  She has been evaluated by her doctor for the shoulder pain one time.  She denies urinary symptoms.  HPI  Past Medical History:  Diagnosis Date  . Carpal tunnel syndrome   . Lupus     Patient Active Problem List   Diagnosis Date Noted  . Carpal tunnel syndrome     Past Surgical History:  Procedure Laterality Date  . TOTAL HIP ARTHROPLASTY    . TUBAL LIGATION      OB History    No data available       Home Medications    Prior to Admission medications   Medication Sig Start Date End Date Taking? Authorizing Provider  acetaminophen (TYLENOL) 500 MG tablet Take 1,000 mg by mouth every 6 (six) hours as needed for mild pain.    [provider]  azithromycin (ZITHROMAX) 250 MG tablet Take 1 tablet (250 mg total) by mouth daily. Patient not taking: Reported on 07/12/2015 05/12/15   Fayrene Helperran, Bowie, PA-C  benzonatate (TESSALON) 100 MG capsule Take 1 capsule (100 mg total) by mouth every 8 (eight) hours. Patient not taking: Reported on 07/12/2015 05/12/15   Fayrene Helperran,  Bowie, PA-C  budesonide-formoterol [facility](SYMBICORT) 160-4.5 MCG/ACT inhaler Inhale 2 puffs into the lungs 2 (two) times daily. 06/08/15 06/07/16  [provider]  hydroxychloroquine (PLAQUENIL) 200 MG tablet TAKE 3 TABLETS BY MOUTH DAILY 09/06/15   Mannam, Colbert CoyerPraveen, MD  oxyCODONE-acetaminophen (PERCOCET/ROXICET) 5-325 MG tablet Take 1 tablet by mouth every 6 (six) hours as needed for severe pain. 03/23/17   Loc Feinstein, Waylan BogaAlexandra M, PA-C  predniSONE (STERAPRED UNI-PAK 21 TAB) 10 MG (21) TBPK tablet Take by mouth daily. Take 6 tabs by mouth daily  for 2 days, then 5 tabs for 2 days, then 4 tabs for 2 days, then 3 tabs for 2 days, 2 tabs for 2 days, then 1 tab by mouth daily for 2 days 03/23/17   Emi HolesLaw, Arlita Buffkin M, PA-C  traMADol (ULTRAM) 50 MG tablet Take 1 tablet (50 mg total) by mouth 3 (three) times daily as needed. 07/12/15   Mannam, Colbert CoyerPraveen, MD  triamcinolone cream (KENALOG) 0.1 % Apply 1 application topically 2 (two) times daily as needed (itching). 05/12/15   Fayrene Helperran, Bowie, PA-C    Family History Family History  Problem Relation Age of Onset  . Cancer Father   . High blood pressure Mother   . High blood pressure Brother   . High blood pressure Brother   . Lupus Sister   . Lupus Sister  Social History Social History   Tobacco Use  . Smoking status: Current Every Day Smoker    Last attempt to quit: 04/28/2015    Years since quitting: 1.9  . Smokeless tobacco: Never Used  Substance Use Topics  . Alcohol use: Yes    Alcohol/week: 0.0 oz    Comment: occasional  . Drug use: No     Allergies   Aleve [naproxen sodium]   Review of Systems Review of Systems  Constitutional: Negative for fever.  Musculoskeletal: Positive for arthralgias (R hip pain) and back pain. Negative for neck pain.  Neurological: Negative for numbness.     Physical Exam Updated Vital Signs BP 115/65 (BP Location: Left Arm)   Pulse 71   Temp 98.8 F (37.1 C) (Oral)   Resp 18   Ht 5\' 8"  (1.727 m)   Wt 117.9 kg  (260 lb)   LMP 08/19/2016 (Within Months) Comment: neg preg test on 03/23/17  SpO2 100%   BMI 39.53 kg/m   Physical Exam  Constitutional: She appears well-developed and well-nourished. No distress.  HENT:  Head: Normocephalic and atraumatic.  Mouth/Throat: Oropharynx is clear and moist. No oropharyngeal exudate.  Eyes: Conjunctivae are normal. Pupils are equal, round, and reactive to light. Right eye exhibits no discharge. Left eye exhibits no discharge. No scleral icterus.  Neck: Normal range of motion. Neck supple. No thyromegaly present.  Cardiovascular: Normal rate, regular rhythm, normal heart sounds and intact distal pulses. Exam reveals no gallop and no friction rub.  No murmur heard. Pulmonary/Chest: Effort normal and breath sounds normal. No stridor. No respiratory distress. She has no wheezes. She has no rales.  Abdominal: Soft. Bowel sounds are normal. She exhibits no distension. There is no tenderness. There is no rebound and no guarding.  Musculoskeletal: She exhibits no edema.       Back:       Legs: Midline lumbar and right-sided lumbar tenderness extending into the right gluteal region, no anterior hip tenderness  Lymphadenopathy:    She has no cervical adenopathy.  Neurological: She is alert. Coordination normal.  Reflex Scores:      Patellar reflexes are 2+ on the right side and 2+ on the left side. Normal sensation, 5/5 strength to bilateral lower extremities  Skin: Skin is warm and dry. No rash noted. She is not diaphoretic. No pallor.  Psychiatric: She has a normal mood and affect.  Nursing note and vitals reviewed.    ED Treatments / Results  Labs (all labs ordered are listed, but only abnormal results are displayed) Labs Reviewed  POC URINE PREG, ED    EKG  EKG Interpretation None       Radiology Dg Lumbar Spine Complete  Result Date: 03/23/2017 CLINICAL DATA:  47 year old female with right hip and lower back pain for 3 weeks. No injury. Prior  right hip replacement. Initial encounter. EXAM: LUMBAR SPINE - COMPLETE 4+ VIEW COMPARISON:  Right hip films same date dictated separately. No comparison lumbar spine exam. FINDINGS: Normal alignment. Mild L4-5 and L5-S1 disc space narrowing with minimal bilateral facet degenerative changes. No pars defect. Anterior osteophyte T10-11 and T11-12. IMPRESSION: Mild L4-5 and L5-S1 disc space narrowing. Electronically Signed   By: Lacy Duverney M.D.   On: 03/23/2017 14:33   Dg Hip Unilat W Or Wo Pelvis 2-3 Views Right  Result Date: 03/23/2017 CLINICAL DATA:  47 year old female with right hip and lower back pain for 3 weeks. No injury. Initial encounter. EXAM: DG HIP (WITH OR  WITHOUT PELVIS) 2-3V RIGHT COMPARISON:  04/12/2013 plain film exam. FINDINGS: Inferior aspect of the femoral component not included on the frontal view. Sclerotic appearance surrounding the femoral component similar to prior exam without lucency to suggest loosening or infection IMPRESSION: Post right hip replacement without plain film evidence of loosening, infection or fracture. Mild sclerosis surrounds the femoral component and unchanged from 2015. This may reflect result of stress phenomena. Electronically Signed   By: Lacy Duverney M.D.   On: 03/23/2017 14:30    Procedures Procedures (including critical care time)  Medications Ordered in ED Medications  oxyCODONE-acetaminophen (PERCOCET/ROXICET) 5-325 MG per tablet 2 tablet (2 tablets Oral Given 03/23/17 1252)     Initial Impression / Assessment and Plan / ED Course  I have reviewed the triage vital signs and the nursing notes.  Pertinent labs & imaging results that were available during my care of the patient were reviewed by me and considered in my medical decision making (see chart for details).     Patient with stable right hip x-ray.  Lumbar x-ray shows mild L4-5 and L5-S1 disc space narrowing.  Suspect radicular pain.  Considering bilateral shoulder and bilateral hip  pain, patient also should probably be worked up for polymyalgia rheumatica outpatient.  Will refer to rheumatologist in [place], as patient states she could not get back to [facility] to see her rheumatologist she has seen before.  Will start prednisone taper and discharged home with short course of Percocet considering she cannot take NSAIDs and did have improved symptoms in the ED with Percocet.  Normal neuro exam without focal deficits.  No saddle anesthesia or bowel or bladder incontinence.  No fevers.  Strict return precautions discussed.  Patient understands and agrees with plan.  Patient vitals stable and discharged in satisfactory condition.  Patient also evaluated by Dr. Particia Nearing who guided the patient's management and agrees with plan.  Final Clinical Impressions(s) / ED Diagnoses   Final diagnoses:  Right hip pain  Acute right-sided low back pain without sciatica    ED Discharge Orders        Ordered    oxyCODONE-acetaminophen (PERCOCET/ROXICET) 5-325 MG tablet  Every 6 hours PRN     03/23/17 1451    predniSONE (STERAPRED UNI-PAK 21 TAB) 10 MG (21) TBPK tablet  Daily     03/23/17 [address], PA-C 03/23/17 1526    Jacalyn Lefevre, MD 03/23/17 1528

## 2017-03-23 NOTE — ED Triage Notes (Signed)
Per Pt, Pt is coming from home with complaints of right hip pain that has started worsening over the last couple weeks. Pt has hx of hip replacement. Denies any urinary symptoms.

## 2018-03-17 ENCOUNTER — Emergency Department (HOSPITAL_COMMUNITY): Payer: Medicare Other

## 2018-03-17 ENCOUNTER — Encounter (HOSPITAL_COMMUNITY): Payer: Self-pay | Admitting: *Deleted

## 2018-03-17 ENCOUNTER — Emergency Department (HOSPITAL_COMMUNITY)
Admission: EM | Admit: 2018-03-17 | Discharge: 2018-03-17 | Disposition: A | Discharge status: Home / Self Care | Payer: Medicare Other | Attending: Emergency Medicine | Admitting: Emergency Medicine

## 2018-03-17 DIAGNOSIS — M25512 Pain in left shoulder: Secondary | ICD-10-CM | POA: Insufficient documentation

## 2018-03-17 DIAGNOSIS — M25511 Pain in right shoulder: Secondary | ICD-10-CM

## 2018-03-17 DIAGNOSIS — R202 Paresthesia of skin: Secondary | ICD-10-CM | POA: Diagnosis not present

## 2018-03-17 DIAGNOSIS — Z96649 Presence of unspecified artificial hip joint: Secondary | ICD-10-CM | POA: Diagnosis not present

## 2018-03-17 DIAGNOSIS — Z79899 Other long term (current) drug therapy: Secondary | ICD-10-CM | POA: Diagnosis not present

## 2018-03-17 DIAGNOSIS — F17200 Nicotine dependence, unspecified, uncomplicated: Secondary | ICD-10-CM | POA: Insufficient documentation

## 2018-03-17 LAB — CBG MONITORING, ED: Glucose-Capillary: 84 mg/dL (ref 70–99)

## 2018-03-17 MED ORDER — PREDNISONE 10 MG (21) PO TBPK: ORAL_TABLET | Freq: Every day | ORAL | 0 refills | Status: DC

## 2018-03-17 MED ORDER — OXYCODONE-ACETAMINOPHEN 5-325 MG PO TABS: 1.0000 | ORAL_TABLET | Freq: Four times a day (QID) | ORAL | 0 refills | Status: DC | PRN

## 2018-03-17 MED ORDER — OXYCODONE-ACETAMINOPHEN 5-325 MG PO TABS
1.0000 | ORAL_TABLET | Freq: Once | ORAL | Status: AC
  Administered 2018-03-17: 1 via ORAL
  Filled 2018-03-17: qty 1

## 2018-03-17 NOTE — ED Triage Notes (Addendum)
Pt in c/o numbness to her bilateral toes and and hands for "awhile" also bilateral shoulder pain, states she fell on christmas eve and further injured her shoulder, also reports vomiting x1 this morning with streaks of blood noted, states she was sick last week with a GI bug that has resolved but vomited x1 today

## 2018-03-17 NOTE — Discharge Instructions (Addendum)
Please read the attached information.  Please follow-up with your rheumatologist as we discussed.  Please return immediately if he develop any new or worsening signs or symptoms.

## 2018-03-17 NOTE — ED Provider Notes (Signed)
[facility] Provider Note   CSN: [id number] Arrival date & time: 03/17/18  1040   History   Chief Complaint Chief Complaint  Patient presents with  . Numbness    HPI Lisa Foley is a 48 y.o. female.  HPI   48 year old female with a past medical history of lupus presents today with numerous complaints.  Patient notes several years ago she was diagnosed with lupus, [place].  She notes she was able to see them once but has been unable to follow-up since then.  Patient notes that she has chronic pain in her bilateral shoulders but fell approximately 2 weeks ago in the right shoulder and has had significant pain since that time.  Patient notes she has been able to work but is limited secondary to pain.  Patient notes that for a year she has had intermittent numbness in her fingers and toes with some associated pain.  Patient notes using Tylenol at home without significant improvement in her symptoms.  Patient notes last week she had nausea vomiting diarrhea which resolved, she had one episode of vomiting today with small amount of red blood in it.  She denies any associated domino pain or fever.  She denies any swelling to the the joints, no significant rashes today.   Past Medical History:  Diagnosis Date  . Carpal tunnel syndrome   . Lupus [facility](HCC)     Patient Active Problem List   Diagnosis Date Noted  . Carpal tunnel syndrome     Past Surgical History:  Procedure Laterality Date  . TOTAL HIP ARTHROPLASTY    . TUBAL LIGATION       OB History   No obstetric history on file.      Home Medications    Prior to Admission medications   Medication Sig Start Date End Date Taking? Authorizing Provider  acetaminophen (TYLENOL) 500 MG tablet Take 1,000 mg by mouth every 6 (six) hours as needed for mild pain.    [provider]  azithromycin  (ZITHROMAX) 250 MG tablet Take 1 tablet (250 mg total) by mouth daily. Patient not taking: Reported on 07/12/2015 05/12/15   Fayrene Helperran, [place], PA-C  benzonatate (TESSALON) 100 MG capsule Take 1 capsule (100 mg total) by mouth every 8 (eight) hours. Patient not taking: Reported on 07/12/2015 05/12/15   Fayrene Helperran, [place], PA-C  budesonide-formoterol [facility](SYMBICORT) 160-4.5 MCG/ACT inhaler Inhale 2 puffs into the lungs 2 (two) times daily. 06/08/15 06/07/16  [provider]  hydroxychloroquine (PLAQUENIL) 200 MG tablet TAKE 3 TABLETS BY MOUTH DAILY 09/06/15   Mannam, Praveen, MD  oxyCODONE-acetaminophen (PERCOCET/ROXICET) 5-325 MG tablet Take 1 tablet by mouth every 6 (six) hours as needed. 03/17/18   Evanie Buckle, Tinnie GensJeffrey, PA-C  predniSONE (STERAPRED UNI-PAK 21 TAB) 10 MG (21) TBPK tablet Take by mouth daily. Take 6 tabs by mouth daily  for 2 days, then 5 tabs for 2 days, then 4 tabs for 2 days, then 3 tabs for 2 days, 2 tabs for 2 days, then 1 tab by mouth daily for 2 days 03/17/18   Elieser Tetrick, Tinnie GensJeffrey, PA-C  traMADol (ULTRAM) 50 MG tablet Take 1 tablet (50 mg total) by mouth 3 (three) times daily as needed. 07/12/15   Mannam, Colbert CoyerPraveen, MD  triamcinolone cream (KENALOG) 0.1 % Apply 1 application topically 2 (two) times daily as needed (itching). 05/12/15   Fayrene Helperran, [place], PA-C    Family History Family History  Problem Relation Age of Onset  . Cancer Father   . High blood pressure Mother   . High blood pressure Brother   . High blood pressure Brother   . Lupus Sister   . Lupus Sister     Social History Social History   Tobacco Use  . Smoking status: Current Every Day Smoker    Last attempt to quit: 04/28/2015    Years since quitting: 2.8  . Smokeless tobacco: Never Used  Substance Use Topics  . Alcohol use: Yes    Alcohol/week: 0.0 standard drinks    Comment: occasional  . Drug use: No     Allergies   Aleve [naproxen sodium]   Review of Systems Review of Systems  All other systems reviewed and are  negative.   Physical Exam Updated Vital Signs BP (!) 135/99 (BP Location: Right Arm)   Pulse 90   Temp 98 F (36.7 C) (Oral)   Resp 20   SpO2 100%   Physical Exam Vitals signs and nursing note reviewed.  Constitutional:      Appearance: She is well-developed.  HENT:     Head: Normocephalic and atraumatic.  Eyes:     General: No scleral icterus.       Right eye: No discharge.        Left eye: No discharge.     Conjunctiva/sclera: Conjunctivae normal.     Pupils: Pupils are equal, round, and reactive to light.  Neck:     Musculoskeletal: Normal range of motion.     Vascular: No JVD.     Trachea: No tracheal deviation.  Pulmonary:     Effort: Pulmonary effort is normal. No respiratory distress.     Breath sounds: No stridor. No wheezing, rhonchi or rales.  Chest:     Chest wall: No tenderness.  Musculoskeletal:     Comments: Right shoulder exquisitely tender to palpation diffusely limited range of motion, no overlying redness or warmth to touch, left shoulder tenderness along the acromium, no redness warmth, full range of motion with some pain-hands with no significant swelling edema redness, nontender to palpation, decreased sensation to the fingertips diffusely-toes warm and well perfused with no significant deficits, no swelling or redness  Neurological:     Mental Status: She is alert and oriented to person, place, and time.     Coordination: Coordination normal.  Psychiatric:        Behavior: Behavior normal.        Thought Content: Thought content normal.        Judgment: Judgment normal.      ED Treatments / Results  Labs (all labs ordered are listed, but only abnormal results are displayed) Labs Reviewed  CBG MONITORING, ED    EKG None  Radiology Dg Shoulder Right  Result Date: 03/17/2018 CLINICAL DATA:  Bilateral shoulder pain, worse on the right, after falling. EXAM: RIGHT SHOULDER - 2+ VIEW COMPARISON:  None. FINDINGS: The mineralization and alignment  are normal. There is no evidence of acute fracture or dislocation. The subacromial space is preserved. There are mild acromioclavicular degenerative changes and mild spurring of the greater tuberosity. No focal soft tissue abnormality identified. IMPRESSION: No acute osseous findings. Electronically Signed   By: Carey BullocksWilliam  Veazey M.D.   On: 03/17/2018 12:31    Procedures Procedures (including critical care time)  Medications Ordered in ED Medications  oxyCODONE-acetaminophen (PERCOCET/ROXICET) 5-325 MG per tablet 1 tablet (1 tablet Oral Given 03/17/18 1144)     Initial Impression /  Assessment and Plan / ED Course  I have reviewed the triage vital signs and the nursing notes.  Pertinent labs & imaging results that were available during my care of the patient were reviewed by me and considered in my medical decision making (see chart for details).     48 year old female with a history of lupus presents today with complaints of shoulder pain and numbness.  The numbness is been ongoing for several years with no significant changes.  Her right shoulder no signs of trauma no signs of infection.  Uncertain etiology, I did review her previous rheumatology notes, they did not believe her symptoms were consistent with lupus.  Patient did note some improvement with prednisone.  I do find it reasonable to give her pain medicine, prednisone, encouraged her to follow-up closely with rheumatology.  Patient verbalized understanding and agreement to today's plan had no further questions or concerns.    Final Clinical Impressions(s) / ED Diagnoses   Final diagnoses:  Pain of both shoulder joints  Paresthesia    ED Discharge Orders         Ordered    predniSONE (STERAPRED UNI-PAK 21 TAB) 10 MG (21) TBPK tablet  Daily     03/17/18 1317    oxyCODONE-acetaminophen (PERCOCET/ROXICET) 5-325 MG tablet  Every 6 hours PRN     03/17/18 1317           Eyvonne Mechanic, PA-C 03/17/18 1357    Sabas Sous, MD 03/19/18 1302

## 2018-09-05 ENCOUNTER — Other Ambulatory Visit: Payer: Self-pay

## 2018-09-05 ENCOUNTER — Inpatient Hospital Stay (HOSPITAL_COMMUNITY)
Admission: EM | Admit: 2018-09-05 | Discharge: 2018-09-07 | DRG: 178 | Disposition: A | Discharge status: Home / Self Care | Payer: Medicare Other | Attending: Family Medicine | Admitting: Family Medicine

## 2018-09-05 ENCOUNTER — Encounter (HOSPITAL_COMMUNITY): Payer: Self-pay

## 2018-09-05 ENCOUNTER — Emergency Department (HOSPITAL_COMMUNITY): Payer: Medicare Other

## 2018-09-05 DIAGNOSIS — Z9851 Tubal ligation status: Secondary | ICD-10-CM | POA: Diagnosis not present

## 2018-09-05 DIAGNOSIS — A0839 Other viral enteritis: Secondary | ICD-10-CM | POA: Diagnosis present

## 2018-09-05 DIAGNOSIS — Z79899 Other long term (current) drug therapy: Secondary | ICD-10-CM | POA: Diagnosis not present

## 2018-09-05 DIAGNOSIS — E669 Obesity, unspecified: Secondary | ICD-10-CM | POA: Diagnosis present

## 2018-09-05 DIAGNOSIS — Z7951 Long term (current) use of inhaled steroids: Secondary | ICD-10-CM

## 2018-09-05 DIAGNOSIS — U071 COVID-19: Principal | ICD-10-CM | POA: Diagnosis present

## 2018-09-05 DIAGNOSIS — Z6833 Body mass index (BMI) 33.0-33.9, adult: Secondary | ICD-10-CM

## 2018-09-05 DIAGNOSIS — Z7952 Long term (current) use of systemic steroids: Secondary | ICD-10-CM | POA: Diagnosis not present

## 2018-09-05 DIAGNOSIS — Z96641 Presence of right artificial hip joint: Secondary | ICD-10-CM | POA: Diagnosis present

## 2018-09-05 DIAGNOSIS — G5600 Carpal tunnel syndrome, unspecified upper limb: Secondary | ICD-10-CM | POA: Diagnosis present

## 2018-09-05 DIAGNOSIS — IMO0002 Reserved for concepts with insufficient information to code with codable children: Secondary | ICD-10-CM | POA: Diagnosis present

## 2018-09-05 DIAGNOSIS — F17210 Nicotine dependence, cigarettes, uncomplicated: Secondary | ICD-10-CM | POA: Diagnosis present

## 2018-09-05 DIAGNOSIS — R0600 Dyspnea, unspecified: Secondary | ICD-10-CM | POA: Diagnosis not present

## 2018-09-05 DIAGNOSIS — Z888 Allergy status to other drugs, medicaments and biological substances status: Secondary | ICD-10-CM | POA: Diagnosis not present

## 2018-09-05 DIAGNOSIS — M3213 Lung involvement in systemic lupus erythematosus: Secondary | ICD-10-CM | POA: Diagnosis present

## 2018-09-05 DIAGNOSIS — M1611 Unilateral primary osteoarthritis, right hip: Secondary | ICD-10-CM | POA: Diagnosis present

## 2018-09-05 DIAGNOSIS — R0902 Hypoxemia: Secondary | ICD-10-CM | POA: Diagnosis present

## 2018-09-05 DIAGNOSIS — J208 Acute bronchitis due to other specified organisms: Secondary | ICD-10-CM

## 2018-09-05 DIAGNOSIS — M329 Systemic lupus erythematosus, unspecified: Secondary | ICD-10-CM

## 2018-09-05 DIAGNOSIS — J8410 Pulmonary fibrosis, unspecified: Secondary | ICD-10-CM | POA: Diagnosis present

## 2018-09-05 DIAGNOSIS — Z832 Family history of diseases of the blood and blood-forming organs and certain disorders involving the immune mechanism: Secondary | ICD-10-CM

## 2018-09-05 DIAGNOSIS — J9600 Acute respiratory failure, unspecified whether with hypoxia or hypercapnia: Secondary | ICD-10-CM

## 2018-09-05 HISTORY — DX: Other viral enteritis: A08.39

## 2018-09-05 HISTORY — DX: COVID-19: U07.1

## 2018-09-05 HISTORY — DX: Acute bronchitis due to other specified organisms: J20.8

## 2018-09-05 LAB — CBC
HCT: 36.5 % (ref 36.0–46.0)
Hemoglobin: 11.3 g/dL — ABNORMAL LOW (ref 12.0–15.0)
MCH: 29.8 pg (ref 26.0–34.0)
MCHC: 31 g/dL (ref 30.0–36.0)
MCV: 96.3 fL (ref 80.0–100.0)
Platelets: 235 10*3/uL (ref 150–400)
RBC: 3.79 MIL/uL — ABNORMAL LOW (ref 3.87–5.11)
RDW: 13.1 % (ref 11.5–15.5)
WBC: 4.5 10*3/uL (ref 4.0–10.5)
nRBC: 0 % (ref 0.0–0.2)

## 2018-09-05 LAB — COMPREHENSIVE METABOLIC PANEL
ALT: 47 U/L — ABNORMAL HIGH (ref 0–44)
AST: 56 U/L — ABNORMAL HIGH (ref 15–41)
Albumin: 3.1 g/dL — ABNORMAL LOW (ref 3.5–5.0)
Alkaline Phosphatase: 55 U/L (ref 38–126)
Anion gap: 9 (ref 5–15)
BUN: <5 mg/dL — ABNORMAL LOW (ref 6–20)
CO2: 25 mmol/L (ref 22–32)
Calcium: 9.1 mg/dL (ref 8.9–10.3)
Chloride: 103 mmol/L (ref 98–111)
Creatinine, Ser: 0.5 mg/dL (ref 0.44–1.00)
GFR calc Af Amer: >60 mL/min (ref >60)
GFR calc non Af Amer: >60 mL/min (ref >60)
Glucose, Bld: 75 mg/dL (ref 70–99)
Potassium: 3.7 mmol/L (ref 3.5–5.1)
Sodium: 137 mmol/L (ref 135–145)
Total Bilirubin: 0.8 mg/dL (ref 0.3–1.2)
Total Protein: 9.7 g/dL — ABNORMAL HIGH (ref 6.5–8.1)

## 2018-09-05 LAB — CBC WITH DIFFERENTIAL/PLATELET
Abs Immature Granulocytes: 0.01 10*3/uL (ref 0.00–0.07)
Basophils Absolute: 0 10*3/uL (ref 0.0–0.1)
Basophils Relative: 0 %
Eosinophils Absolute: 0.1 10*3/uL (ref 0.0–0.5)
Eosinophils Relative: 2 %
HCT: 37.3 % (ref 36.0–46.0)
Hemoglobin: 11.5 g/dL — ABNORMAL LOW (ref 12.0–15.0)
Immature Granulocytes: 0 %
Lymphocytes Relative: 31 %
Lymphs Abs: 1.4 10*3/uL (ref 0.7–4.0)
MCH: 29.8 pg (ref 26.0–34.0)
MCHC: 30.8 g/dL (ref 30.0–36.0)
MCV: 96.6 fL (ref 80.0–100.0)
Monocytes Absolute: 0.3 10*3/uL (ref 0.1–1.0)
Monocytes Relative: 7 %
Neutro Abs: 2.8 10*3/uL (ref 1.7–7.7)
Neutrophils Relative %: 60 %
Platelets: 253 10*3/uL (ref 150–400)
RBC: 3.86 MIL/uL — ABNORMAL LOW (ref 3.87–5.11)
RDW: 13.1 % (ref 11.5–15.5)
WBC: 4.6 10*3/uL (ref 4.0–10.5)
nRBC: 0 % (ref 0.0–0.2)

## 2018-09-05 LAB — CREATININE, SERUM
Creatinine, Ser: 0.45 mg/dL (ref 0.44–1.00)
GFR calc Af Amer: >60 mL/min (ref >60)
GFR calc non Af Amer: >60 mL/min (ref >60)

## 2018-09-05 LAB — TRIGLYCERIDES: Triglycerides: 124 mg/dL (ref <150)

## 2018-09-05 LAB — C-REACTIVE PROTEIN: CRP: 2.2 mg/dL — ABNORMAL HIGH (ref <1.0)

## 2018-09-05 LAB — D-DIMER, QUANTITATIVE: D-Dimer, Quant: 2.14 ug/mL-FEU — ABNORMAL HIGH (ref 0.00–0.50)

## 2018-09-05 LAB — LACTATE DEHYDROGENASE: LDH: 285 U/L — ABNORMAL HIGH (ref 98–192)

## 2018-09-05 LAB — PROCALCITONIN: Procalcitonin: <0.1 ng/mL

## 2018-09-05 LAB — LACTIC ACID, PLASMA
Lactic Acid, Venous: 2.4 mmol/L (ref 0.5–1.9)
Lactic Acid, Venous: 1.5 mmol/L (ref 0.5–1.9)

## 2018-09-05 LAB — FERRITIN: Ferritin: 143 ng/mL (ref 11–307)

## 2018-09-05 LAB — I-STAT BETA HCG BLOOD, ED (MC, WL, AP ONLY): I-stat hCG, quantitative: <5 m[IU]/mL (ref <5)

## 2018-09-05 LAB — FIBRINOGEN: Fibrinogen: 475 mg/dL (ref 210–475)

## 2018-09-05 MED ORDER — DIPHENHYDRAMINE HCL 25 MG PO CAPS
25.0000 mg | ORAL_CAPSULE | Freq: Once | ORAL | Status: AC
  Administered 2018-09-05: 25 mg via ORAL
  Filled 2018-09-05: qty 1

## 2018-09-05 MED ORDER — METHYLPREDNISOLONE SODIUM SUCC 125 MG IJ SOLR
60.0000 mg | Freq: Two times a day (BID) | INTRAMUSCULAR | Status: DC
  Administered 2018-09-06 – 2018-09-07 (×3): 60 mg via INTRAVENOUS
  Filled 2018-09-05 (×3): qty 2

## 2018-09-05 MED ORDER — ONDANSETRON HCL 4 MG/2ML IJ SOLN: 4.0000 mg | Freq: Four times a day (QID) | INTRAMUSCULAR | Status: DC | PRN

## 2018-09-05 MED ORDER — ACETAMINOPHEN 325 MG PO TABS
650.0000 mg | ORAL_TABLET | Freq: Once | ORAL | Status: AC
  Administered 2018-09-05: 650 mg via ORAL
  Filled 2018-09-05: qty 2

## 2018-09-05 MED ORDER — MOMETASONE FURO-FORMOTEROL FUM 200-5 MCG/ACT IN AERO
2.0000 | INHALATION_SPRAY | Freq: Two times a day (BID) | RESPIRATORY_TRACT | Status: DC
  Administered 2018-09-06 – 2018-09-07 (×3): 2 via RESPIRATORY_TRACT
  Filled 2018-09-05: qty 8.8

## 2018-09-05 MED ORDER — IPRATROPIUM-ALBUTEROL 20-100 MCG/ACT IN AERS
1.0000 | INHALATION_SPRAY | Freq: Four times a day (QID) | RESPIRATORY_TRACT | Status: DC | PRN
  Filled 2018-09-05: qty 4

## 2018-09-05 MED ORDER — ONDANSETRON HCL 4 MG PO TABS
4.0000 mg | ORAL_TABLET | Freq: Four times a day (QID) | ORAL | Status: DC | PRN
  Administered 2018-09-07: 4 mg via ORAL
  Filled 2018-09-05: qty 1

## 2018-09-05 MED ORDER — ALBUTEROL SULFATE HFA 108 (90 BASE) MCG/ACT IN AERS
6.0000 | INHALATION_SPRAY | Freq: Once | RESPIRATORY_TRACT | Status: AC
  Administered 2018-09-05: 6 via RESPIRATORY_TRACT
  Filled 2018-09-05: qty 6.7

## 2018-09-05 MED ORDER — HYDROXYCHLOROQUINE SULFATE 200 MG PO TABS: 600.0000 mg | ORAL_TABLET | Freq: Every day | ORAL | Status: DC

## 2018-09-05 MED ORDER — ENOXAPARIN SODIUM 40 MG/0.4ML ~~LOC~~ SOLN: 40.0000 mg | SUBCUTANEOUS | Status: DC

## 2018-09-05 MED ORDER — BENZONATATE 100 MG PO CAPS
100.0000 mg | ORAL_CAPSULE | Freq: Three times a day (TID) | ORAL | Status: DC
  Administered 2018-09-05 – 2018-09-06 (×3): 100 mg via ORAL
  Filled 2018-09-05 (×3): qty 1

## 2018-09-05 MED ORDER — METOCLOPRAMIDE HCL 10 MG PO TABS
10.0000 mg | ORAL_TABLET | Freq: Once | ORAL | Status: AC
  Administered 2018-09-05: 10 mg via ORAL
  Filled 2018-09-05: qty 1

## 2018-09-05 MED ORDER — AEROCHAMBER PLUS FLO-VU LARGE MISC: 1.0000 | Freq: Once | Status: DC

## 2018-09-05 MED ORDER — OXYCODONE-ACETAMINOPHEN 5-325 MG PO TABS
1.0000 | ORAL_TABLET | Freq: Four times a day (QID) | ORAL | Status: DC | PRN
  Administered 2018-09-06 – 2018-09-07 (×2): 1 via ORAL
  Filled 2018-09-05 (×2): qty 1

## 2018-09-05 MED ORDER — ACETAMINOPHEN 325 MG PO TABS
650.0000 mg | ORAL_TABLET | Freq: Four times a day (QID) | ORAL | Status: DC | PRN
  Administered 2018-09-06 (×3): 650 mg via ORAL
  Filled 2018-09-05 (×4): qty 2

## 2018-09-05 MED ORDER — GUAIFENESIN ER 600 MG PO TB12
600.0000 mg | ORAL_TABLET | Freq: Two times a day (BID) | ORAL | Status: DC
  Administered 2018-09-05 – 2018-09-07 (×4): 600 mg via ORAL
  Filled 2018-09-05 (×4): qty 1

## 2018-09-05 NOTE — ED Notes (Signed)
IV team at bedside 

## 2018-09-05 NOTE — ED Triage Notes (Addendum)
Patient complains of shortness of breath,  slight headache, productive cough and diarrhea. Tested positive for coronavirus on Thursday. Has lupus.

## 2018-09-05 NOTE — H&P (Signed)
History and Physical    DOA: 09/05/2018  PCP: Patient, No Pcp Per  Patient coming from: home  Chief Complaint: Cough, shortness of breath, myalgias  HPI: Lisa Foley is a 48 y.o. 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 She also reports diarrhea since yesterday. Denies fevers but was feeling "hot and cold".   Patient tachypneic, hypoxic on arrival (less than 90% per ED physician) requiring 2 L O2, normotensive.  Work-up in the ED revealed normal BMP, mildly elevated LFTs, normal white count, d-dimer at 2.14, LDH of 285 and C-reactive protein protein of 2.2.  CXR shows left midlung and right base fibrosis with no new infiltrates or edema.   Review of Systems: As per HPI otherwise 10 point review of systems negative.    Past Medical History:  Diagnosis Date  . Carpal tunnel syndrome   . Lupus [facility](HCC)     Past Surgical History:  Procedure Laterality Date  . TOTAL HIP ARTHROPLASTY    . TUBAL LIGATION      Social history:  reports that she has been smoking. She has never used smokeless tobacco. She reports current alcohol use. She reports that she does not use drugs. 1/2 PPD X 10 yrs, alcohol use ocassional , no IVDA. Live with 3 sons and son's girlfriend and 2 grandchildren.    Allergies  Allergen Reactions  . Aleve [Naproxen Sodium] Swelling    Family History  Problem Relation Age of Onset  . Cancer Father   . High blood pressure Mother   . High blood  pressure Brother   . High blood pressure Brother   . Lupus Sister   . Lupus Sister       Prior to Admission medications   Medication Sig Start Date End Date Taking? Authorizing Provider  acetaminophen (TYLENOL) 500 MG tablet Take 1,000 mg by mouth every 6 (six) hours as needed for mild pain.    [provider]  azithromycin (ZITHROMAX) 250 MG tablet Take 1 tablet (250 mg total) by mouth daily. Patient not taking: Reported on 07/12/2015 05/12/15   Fayrene Helperran, [place], PA-C  benzonatate (TESSALON) 100 MG capsule Take 1 capsule (100 mg total) by mouth every 8 (eight) hours. Patient not taking: Reported on 07/12/2015 05/12/15   Fayrene Helperran, [place], PA-C  budesonide-formoterol [facility](SYMBICORT) 160-4.5 MCG/ACT inhaler Inhale 2 puffs into the lungs 2 (two) times daily. 06/08/15 06/07/16  [provider]  hydroxychloroquine (PLAQUENIL) 200 MG tablet TAKE 3 TABLETS BY MOUTH DAILY 09/06/15   Mannam, Praveen, MD  oxyCODONE-acetaminophen (PERCOCET/ROXICET) 5-325 MG tablet Take 1 tablet by mouth every 6 (six) hours as needed. 03/17/18   Hedges, Tinnie GensJeffrey, PA-C  predniSONE (STERAPRED UNI-PAK 21 TAB) 10 MG (21) TBPK tablet Take by mouth daily. Take 6 tabs by mouth daily  for 2 days, then 5 tabs for 2 days, then 4 tabs for 2 days, then 3 tabs for 2 days, 2 tabs for 2 days, then 1 tab by mouth daily for 2 days 03/17/18   Eyvonne MechanicHedges, Jeffrey, PA-C  traMADol [facility](ULTRAM)  50 MG tablet Take 1 tablet (50 mg total) by mouth 3 (three) times daily as needed. 07/12/15   Mannam, Hart Robinsons, MD  triamcinolone cream (KENALOG) 0.1 % Apply 1 application topically 2 (two) times daily as needed (itching). 05/12/15   Domenic Moras, PA-C    Physical Exam: Vitals:   09/05/18 1530 09/05/18 1630 09/05/18 1730 09/05/18 1830  BP: 127/77 123/81 114/75 116/82  Pulse: 92 71 77 71  Resp: (!) 21 (!) 24 (!) 26 (!) 22  Temp:      TempSrc:      SpO2: 98% 100% 100% 100%    Constitutional: NAD, calm, comfortable Eyes: PERRL, lids and conjunctivae normal ENMT: Mucous  membranes are moist. Posterior pharynx clear of any exudate or lesions.Normal dentition.  Neck: normal, supple, no masses, no thyromegaly Respiratory:  no wheezing, mild right basilar crackles, otherwise clear to auscultation bilaterally, Normal respiratory effort. No accessory muscle use.  Cardiovascular: Regular rate and rhythm, no murmurs / rubs / gallops. No extremity edema. 2+ pedal pulses. No carotid bruits.  Abdomen: no tenderness, no masses palpated. No hepatosplenomegaly. Bowel sounds positive.  Musculoskeletal: no clubbing / cyanosis. No joint deformity upper and lower extremities. Good ROM, no contractures. Normal muscle tone.  Neurologic: CN 2-12 grossly intact. Sensation intact, DTR normal. Strength 5/5 in all 4.  Psychiatric: Normal judgment and insight. Alert and oriented x 3. Normal mood.  SKIN/catheters: no rashes, lesions, ulcers. No induration  Labs on Admission: I have personally reviewed following labs and imaging studies  CBC: Recent Labs  Lab 09/05/18 1539  WBC 4.6  NEUTROABS 2.8  HGB 11.5*  HCT 37.3  MCV 96.6  PLT 284   Basic Metabolic Panel: Recent Labs  Lab 09/05/18 1539  NA 137  K 3.7  CL 103  CO2 25  GLUCOSE 75  BUN <5*  CREATININE 0.50  CALCIUM 9.1   GFR: CrCl cannot be calculated (Unknown ideal weight.). Liver Function Tests: Recent Labs  Lab 09/05/18 1539  AST 56*  ALT 47*  ALKPHOS 55  BILITOT 0.8  PROT 9.7*  ALBUMIN 3.1*   No results for input(s): LIPASE, AMYLASE in the last 168 hours. No results for input(s): AMMONIA in the last 168 hours. Coagulation Profile: No results for input(s): INR, PROTIME in the last 168 hours. Cardiac Enzymes: No results for input(s): CKTOTAL, CKMB, CKMBINDEX, TROPONINI in the last 168 hours. BNP (last 3 results) No results for input(s): PROBNP in the last 8760 hours. HbA1C: No results for input(s): HGBA1C in the last 72 hours. CBG: No results for input(s): GLUCAP in the last 168 hours. Lipid  Profile: Recent Labs    09/05/18 1539  TRIG 124   Thyroid Function Tests: No results for input(s): TSH, T4TOTAL, FREET4, T3FREE, THYROIDAB in the last 72 hours. Anemia Panel: Recent Labs    09/05/18 1539  FERRITIN 143   Urine analysis:    Component Value Date/Time   COLORURINE YELLOW 11/14/2014 2119   APPEARANCEUR CLEAR 11/14/2014 2119   LABSPEC 1.022 11/14/2014 2119   PHURINE 5.0 11/14/2014 2119   [place] NEGATIVE 11/14/2014 2119   HGBUR NEGATIVE 11/14/2014 2119   [place] NEGATIVE 11/14/2014 2119   [place] NEGATIVE 11/14/2014 2119   PROTEINUR NEGATIVE 11/14/2014 2119   UROBILINOGEN 1.0 11/14/2014 2119   NITRITE NEGATIVE 11/14/2014 2119   LEUKOCYTESUR NEGATIVE 11/14/2014 2119    Radiological Exams on Admission: Personally reviewed  Dg Chest Portable 1 View  Result Date: 09/05/2018 CLINICAL DATA:  Cough and shortness of breath EXAM: PORTABLE  CHEST 1 VIEW COMPARISON:  May 12, 2015 FINDINGS: There is scarring in the left mid lung. There is probable fibrosis in the right base, stable. There is no frank edema or consolidation. Heart is upper normal in size with pulmonary vascularity normal. No adenopathy. There is degenerative change in the thoracic spine. IMPRESSION: Scarring left mid lung, stable. Fibrotic change right base, stable. No frank edema or consolidation. Stable cardiac silhouette. No evident adenopathy. Electronically Signed   By: Bretta BangWilliam  Woodruff III M.D.   On: 09/05/2018 14:46    EKG: Independently reviewed.  Normal sinus rhythm with QTC at 430 ms     Assessment and Plan:   Principal Problem:   Acute bronchitis due to COVID-19 virus Active Problems:   Lupus (HCC)   Nicotine dependence, cigarettes, uncomplicated   Osteoarthritis of right hip   Gastroenteritis due to COVID-19 virus    1. Acute bronchitis with respiratory distress due to COVID-19: Patient currently saturating well on room air.  Per review of nursing documentation, she was 98% on  room air upon arrival as well but was tachypneic with minimal exertion.  Will admit to [facility] with IV steroids, MDI, mucolytic's/antitussives.  Inflammatory markers including d-dimer, LDH, C-reactive protein as above.  She will be admitted with contact precautions/low risk status.  Ferritin ordered for a.m.  Chest x-ray in the ED with nonspecific findings as above.  Repeat chest x-ray PA/Lat after IV hydration for possible early developing pneumonia.  2.  Acute gastroenteritis: In the setting of COVID positive status: Symptomatic/supportive management with IV hydration, antidiarrheals.    3.  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  Patient was supposed to be taking Plaquenil and prednisone (?  20 mg) but has not been taking any for the last few months.  Will resume Plaquenil and currently being started on IV steroids for problem #1.  4.  Osteoarthritis of right hip: Status post hip arthroplasty 7 years back.  Pain meds as needed  5.  Tobacco dependence: Counseled to quit.  MDI as needed  DVT prophylaxis: Lovenox  COVID screen: +ve  Code Status:  Full code. Health care proxy would be Halford ChessmanJoyce Foley (sister) :[phone]  Patient/Family Communication: Discussed with patient and all questions answered to satisfaction.  Consults called: Communicated with [facility] hospitalist Dr. Thedore MinsSingh Admission status : I certify that at the point of admission it is my clinical judgment that the patient will require inpatient hospital care spanning beyond 2 midnights from the point of admission due to high intensity of service and high frequency of surveillance required.Inpatient status is judged to be reasonable and necessary in order to provide the required intensity of service to ensure the patient's safety. The patient's presenting symptoms, physical exam findings, and initial radiographic and laboratory data in the context of their chronic comorbidities is felt to place  them at high risk for further clinical deterioration. The following factors support the patient status of inpatient : Acute respiratory distress/bronchitis and gastroenteritis with COVID-19 positive. Expected LOS: 2 days     Alessandra BevelsNeelima Teiara Baria MD [facility] Pager [phone]  If 7PM-7AM, please contact night-coverage [email or web address] Password [email or web address]  09/05/2018, 7:38 PM

## 2018-09-05 NOTE — ED Provider Notes (Signed)
  Physical Exam  BP 127/77   Pulse 92   Temp 98.6 F (37 C) (Oral)   Resp (!) 21   SpO2 98%   Physical Exam  ED Course/Procedures     Procedures  MDM  Patient care received from [facility] PA at shift change, please see her note for a full HPI. Patient with a PMH of lupus with recent tested positive for Covid 19 on 08/30/2018 arrived in the ED with worsening shortness of breath, productive cough and body aches.   Patient in the ED with tachypnea, given albuterol, tylenol, appears uncomfortable and dry and worsening with a,bulation.  Laboratory results remarkable for elevated; Lactic acid 2.3 LDH 285 C-reactive protein 2.2 D dimer positive  All consistent with Covid 19 infection.  CBC with no leukocytosis, slight decrease Hgb. CMP no electrolyte abnormalities, slight elevation of LFT's. DG Chest without consolidation. Patient currently on 2 L of 02, and a new oxygen requirement, will need admission for further management.   5:53 PM Spoke to hospitalist who will admit patient for further management.   Portions of this note were generated with Lobbyist. Dictation errors may occur despite best attempts at proofreading.         Janeece Fitting, PA-C 09/05/18 [place], [place], DO 09/05/18 2059

## 2018-09-05 NOTE — ED Provider Notes (Signed)
[facility]   CSN: [id number] Arrival date & time: 09/05/18  1301    History   Chief Complaint Chief Complaint  Patient presents with  . Shortness of Breath    HPI Lisa Foley is a 48 y.o. female.     HPI   Patient is a 48 year old female with a history of lupus, carpal tunnel syndrome, who presents the emergency department today for evaluation of shortness of breath.  States that she was exposed to someone with coronavirus at her work.  She was advised by her employer to be tested.  States she was tested Monday and got results back a few days ago stating that she was positive.  She has had worsening shortness of breath since yesterday.  She is also had a productive cough.  She has had a mild headache, body aches, sweats, chills and diarrhea.  Denies any nausea or vomiting.  No abdominal pain or chest pain.  Past Medical History:  Diagnosis Date  . Carpal tunnel syndrome   . Lupus [facility](HCC)     Patient Active Problem List   Diagnosis Date Noted  . Acute bronchitis due to COVID-19 virus 09/05/2018  . Gastroenteritis due to COVID-19 virus 09/05/2018  . Lupus (HCC) 06/08/2015  . Nicotine dependence, cigarettes, uncomplicated 06/08/2015  . Carpal tunnel syndrome   . Vitamin D deficiency 01/01/2010  . Osteoarthritis of right hip 06/28/2009    Past Surgical History:  Procedure Laterality Date  . TOTAL HIP ARTHROPLASTY    . TUBAL LIGATION       OB History   No obstetric history on file.      Home Medications    Prior to Admission medications   Medication Sig Start Date End Date Taking? Authorizing Provider  acetaminophen (TYLENOL) 500 MG tablet Take 2 tablets (1,000 mg total) by mouth every 8 (eight) hours as needed for mild pain. Do NOT exceed 4,000mg  in 24 hours 09/07/18   Lisa Foley, Lisa B, MD  budesonide-formoterol [facility](SYMBICORT) 160-4.5 MCG/ACT inhaler Inhale 2 puffs into the lungs 2 (two) times daily. 09/07/18 09/07/19   Lisa Foley, Lisa B, MD  predniSONE (DELTASONE) 20 MG tablet Take 3 tablets (60 mg total) by mouth daily for 3 days, THEN 2 tablets (40 mg total) daily for 3 days, THEN 1 tablet (20 mg total) daily for 3 days, THEN 0.5 tablets (10 mg total) daily for 4 days. 09/07/18 09/20/18  Lisa Foley, Lisa B, MD    Family History Family History  Problem Relation Age of Onset  . Cancer Father   . High blood pressure Mother   . High blood pressure Brother   . High blood pressure Brother   . Lupus Sister   . Lupus Sister     Social History Social History   Tobacco Use  . Smoking status: Current Every Day Smoker    Last attempt to quit: 04/28/2015    Years since quitting: 3.3  . Smokeless tobacco: Never Used  Substance Use Topics  . Alcohol use: Yes    Alcohol/week: 0.0 standard drinks    Comment: occasional  . Drug use: No     Allergies   Aleve [naproxen sodium]   Review of Systems Review of Systems  Constitutional: Positive for appetite change, chills, diaphoresis and fever.  HENT: Positive for sore throat. Negative for ear pain.   Eyes: Negative for visual disturbance.  Respiratory: Positive for cough and shortness of breath.   Cardiovascular: Negative for chest pain.  Gastrointestinal:  Positive for diarrhea. Negative for abdominal pain, constipation, nausea and vomiting.  Genitourinary: Negative for dysuria.  Musculoskeletal: Positive for myalgias.  Skin: Negative for color change.  Neurological: Positive for headaches. Negative for dizziness, weakness, light-headedness and numbness.  All other systems reviewed and are negative.    Physical Exam Updated Vital Signs BP 123/69 (BP Location: Left Arm)   Pulse 86   Temp (!) 97.5 F (36.4 C) (Oral)   Resp (!) 29   Ht 5\' 7"  (1.702 m)   Wt 96.7 kg   SpO2 97%   BMI 33.39 kg/m   Physical Exam Vitals signs and nursing note reviewed.  Constitutional:      General: She is not in acute distress.    Appearance: She is well-developed. She  is ill-appearing.  HENT:     Head: Normocephalic and atraumatic.     Mouth/Throat:     Mouth: Mucous membranes are dry.  Eyes:     Conjunctiva/sclera: Conjunctivae normal.  Neck:     Musculoskeletal: Neck supple.  Cardiovascular:     Rate and Rhythm: Normal rate and regular rhythm.     Pulses: Normal pulses.     Heart sounds: Normal heart sounds. No murmur.  Pulmonary:     Effort: Pulmonary effort is normal. No respiratory distress.     Breath sounds: Examination of the left-upper field reveals wheezing. Wheezing present. No decreased breath sounds, rhonchi or rales.     Comments: tachypneic in the 30s. Able to speak in full sentences Abdominal:     General: Bowel sounds are normal.     Palpations: Abdomen is soft.     Tenderness: There is no abdominal tenderness. There is no guarding or rebound.  Musculoskeletal:     Right lower leg: She exhibits no tenderness. No edema.     Left lower leg: She exhibits no tenderness. No edema.  Skin:    General: Skin is warm and dry.  Neurological:     Mental Status: She is alert.     ED Treatments / Results  Labs (all labs ordered are listed, but only abnormal results are displayed) Labs Reviewed  CBC WITH DIFFERENTIAL/PLATELET - Abnormal; Notable for the following components:      Result Value   RBC 3.86 (*)    Hemoglobin 11.5 (*)    All other components within normal limits  LACTIC ACID, PLASMA - Abnormal; Notable for the following components:   Lactic Acid, Venous 2.4 (*)    All other components within normal limits  COMPREHENSIVE METABOLIC PANEL - Abnormal; Notable for the following components:   BUN <5 (*)    Total Protein 9.7 (*)    Albumin 3.1 (*)    AST 56 (*)    ALT 47 (*)    All other components within normal limits  D-DIMER, QUANTITATIVE (NOT AT [facility]) - Abnormal; Notable for the following components:   D-Dimer, Quant 2.14 (*)    All other components within normal limits  LACTATE DEHYDROGENASE - Abnormal; Notable for  the following components:   LDH 285 (*)    All other components within normal limits  C-REACTIVE PROTEIN - Abnormal; Notable for the following components:   CRP 2.2 (*)    All other components within normal limits  CBC - Abnormal; Notable for the following components:   RBC 3.79 (*)    Hemoglobin 11.3 (*)    All other components within normal limits  CBC WITH DIFFERENTIAL/PLATELET - Abnormal; Notable for the following components:  Hemoglobin 11.6 (*)    All other components within normal limits  C-REACTIVE PROTEIN - Abnormal; Notable for the following components:   CRP 2.1 (*)    All other components within normal limits  COMPREHENSIVE METABOLIC PANEL - Abnormal; Notable for the following components:   Calcium 8.8 (*)    Total Protein 9.2 (*)    Albumin 3.3 (*)    AST 54 (*)    All other components within normal limits  D-DIMER, QUANTITATIVE (NOT AT [facility]) - Abnormal; Notable for the following components:   D-Dimer, Quant >20.00 (*)    All other components within normal limits  ACETAMINOPHEN LEVEL - Abnormal; Notable for the following components:   Acetaminophen (Tylenol), Serum <10 (*)    All other components within normal limits  CBC WITH DIFFERENTIAL/PLATELET - Abnormal; Notable for the following components:   RBC 3.67 (*)    Hemoglobin 10.9 (*)    HCT 35.0 (*)    All other components within normal limits  C-REACTIVE PROTEIN - Abnormal; Notable for the following components:   CRP 2.8 (*)    All other components within normal limits  COMPREHENSIVE METABOLIC PANEL - Abnormal; Notable for the following components:   Glucose, Bld 170 (*)    Total Protein 9.3 (*)    Albumin 3.0 (*)    All other components within normal limits  D-DIMER, QUANTITATIVE (NOT AT [facility]) - Abnormal; Notable for the following components:   D-Dimer, Quant 1.30 (*)    All other components within normal limits  CULTURE, BLOOD (ROUTINE X 2)  CULTURE, BLOOD (ROUTINE X 2)  LACTIC ACID, PLASMA   PROCALCITONIN  FERRITIN  TRIGLYCERIDES  FIBRINOGEN  HIV ANTIBODY (ROUTINE TESTING W REFLEX)  CREATININE, SERUM  FERRITIN  MAGNESIUM  PHOSPHORUS  FERRITIN  MAGNESIUM  PHOSPHORUS  I-STAT BETA HCG BLOOD, ED ([facility], [facility], [facility] ONLY)    EKG EKG Interpretation  Date/Time:  Sunday September 05 2018 14:17:32 EDT Ventricular Rate:  66 PR Interval:  212 QRS Duration: 80 QT Interval:  412 QTC Calculation: 431 R Axis:   48 Text Interpretation:  Sinus rhythm with 1st degree A-V block Otherwise normal ECG Confirmed by Jacalyn Lefevre [phone]) on 09/05/2018 3:08:34 PM   Radiology No results found.  Procedures Procedures (including critical care time)  Medications Ordered in ED Medications  albuterol (VENTOLIN HFA) 108 (90 Base) MCG/ACT inhaler 6 puff (6 puffs Inhalation Given 09/05/18 1453)  acetaminophen (TYLENOL) tablet 650 mg (650 mg Oral Given 09/05/18 1451)  metoCLOPramide (REGLAN) tablet 10 mg (10 mg Oral Given 09/05/18 1801)  diphenhydrAMINE (BENADRYL) capsule 25 mg (25 mg Oral Given 09/05/18 1801)     Initial Impression / Assessment and Plan / ED Course  I have reviewed the triage vital signs and the nursing notes.  Pertinent labs & imaging results that were available during my care of the patient were reviewed by me and considered in my medical decision making (see chart for details).   Final Clinical Impressions(Foley) / ED Diagnoses   Final diagnoses:  COVID-19  Acute respiratory failure, unspecified whether with hypoxia or hypercapnia (HCC)  Acute bronchitis due to COVID-67 virus   48 year old female with a history of lupus presenting the emergency department today for evaluation of shortness of breath.  She was diagnosed with COVID earlier this week.  Has had progressively worsening symptoms.  She denies any chest pain.  Has had some diarrhea.  No vomiting.  No abdominal Pain.  Initial vital signs are reassuring however on exam  patient appears tachypneic with respiratory rate in  the 30s.  She appears uncomfortable.  Chest x-ray with scarring to the mid left lung that appears stable.  Fibrotic change in the right base which is also stable.  No frank edema or consolidation.  Stable cardiac silhouette.  No evident adenopathy.  Patient was ambulated in her room, she was able to maintain her O2 sat to sats however she became even more tachypneic and uncomfortable with ambulation.  She felt very dyspneic.  At shift change, laboratory work is pending.  Care transitioned to Valetta Mole with plan to follow-up on pending laboratory work.  Anticipate admission given patient'Foley severe tachypnea resulting in the requirement of supplemental O2.  Lisa Foley was evaluated in Emergency Department on 09/13/2018 for the symptoms described in the history of present illness. She was evaluated in the context of the global COVID-19 pandemic, which necessitated consideration that the patient might be at risk for infection with the SARS-CoV-2 virus that causes COVID-19. Institutional protocols and algorithms that pertain to the evaluation of patients at risk for COVID-19 are in a state of rapid change based on information released by regulatory bodies including the CDC and federal and state organizations. These policies and algorithms were followed during the patient'Foley care in the ED.  ED Discharge Orders         Ordered    budesonide-formoterol (SYMBICORT) 160-4.5 MCG/ACT inhaler  2 times daily     09/07/18 1142    predniSONE (DELTASONE) 20 MG tablet     09/07/18 1142    MyChart COVID-19 home monitoring program     09/07/18 1142    Increase activity slowly     09/07/18 1142    Discharge instructions    Comments: You are being discharged from the hospital after treatment for covid-19 infection. You doe not require oxygen or further evaluation.  - Please continue taking steroids by following the directions tapering prednisone over the next 13 days. This prescription was sent to your pharmacy.   - Remain in isolation for 2 weeks.  - Do not take NSAID medications (including, but not limited to, ibuprofen, advil, motrin, naproxen, aleve, goody'Foley powder, etc.) - Follow up with your doctor in the next week via telehealth or seek medical attention right away if your symptoms get WORSE.  - Consider donating plasma after you have recovered (either 14 days after a negative test or 28 days after symptoms have completely resolved) because your antibodies to this virus may be helpful to give to others with life-threatening infections. Please go to the website www.[email or web address] if you would like to consider volunteering for plasma donation.    Directions for you at home:  Wear a facemask You should wear a facemask that covers your nose and mouth when you are in the same room with other people and when you visit a healthcare provider. People who live with or visit you should also wear a facemask while they are in the same room with you.  Separate yourself from other people in your home As much as possible, you should stay in a different room from other people in your home. Also, you should use a separate bathroom, if available.  Avoid sharing household items You should not share dishes, drinking glasses, cups, eating utensils, towels, bedding, or other items with other people in your home. After using these items, you should wash them thoroughly with soap and water.  Cover your coughs and sneezes Cover your mouth and nose with a  tissue when you cough or sneeze, or you can cough or sneeze into your sleeve. Throw used tissues in a lined trash can, and immediately wash your hands with soap and water for at least 20 seconds or use an alcohol-based hand rub.  Wash your [employer] Corporationhands Wash your hands often and thoroughly with soap and water for at least 20 seconds. You can use an alcohol-based hand sanitizer if soap and water are not available and if your hands are not visibly dirty. Avoid touching your  eyes, nose, and mouth with unwashed hands.  Directions for those who live with, or provide care at home for you:  Limit the number of people who have contact with the patient If possible, have only one caregiver for the patient. Other household members should stay in another home or place of residence. If this is not possible, they should stay in another room, or be separated from the patient as much as possible. Use a separate bathroom, if available. Restrict visitors who do not have an essential need to be in the home.  Ensure good ventilation Make sure that shared spaces in the home have good air flow, such as from an air conditioner or an opened window, weather permitting.  Wash your hands often Wash your hands often and thoroughly with soap and water for at least 20 seconds. You can use an alcohol based hand sanitizer if soap and water are not available and if your hands are not visibly dirty. Avoid touching your eyes, nose, and mouth with unwashed hands. Use disposable paper towels to dry your hands. If not available, use dedicated cloth towels and replace them when they become wet.  Wear a facemask and gloves Wear a disposable facemask at all times in the room and gloves when you touch or have contact with the patient'Foley blood, body fluids, and/or secretions or excretions, such as sweat, saliva, sputum, nasal mucus, vomit, urine, or feces.  Ensure the mask fits over your nose and mouth tightly, and do not touch it during use. Throw out disposable facemasks and gloves after using them. Do not reuse. Wash your hands immediately after removing your facemask and gloves. If your personal clothing becomes contaminated, carefully remove clothing and launder. Wash your hands after handling contaminated clothing. Place all used disposable facemasks, gloves, and other waste in a lined container before disposing them with other household waste. Remove gloves and wash your hands immediately after  handling these items.  Do not share dishes, glasses, or other household items with the patient Avoid sharing household items. You should not share dishes, drinking glasses, cups, eating utensils, towels, bedding, or other items with a patient who is confirmed to have, or being evaluated for, COVID-19 infection. After the person uses these items, you should wash them thoroughly with soap and water.  Wash laundry thoroughly Immediately remove and wash clothes or bedding that have blood, body fluids, and/or secretions or excretions, such as sweat, saliva, sputum, nasal mucus, vomit, urine, or feces, on them. Wear gloves when handling laundry from the patient. Read and follow directions on labels of laundry or clothing items and detergent. In general, wash and dry with the warmest temperatures recommended on the label.  Clean all areas the individual has used often Clean all touchable surfaces, such as counters, tabletops, doorknobs, bathroom fixtures, toilets, phones, keyboards, tablets, and bedside tables, every day. Also, clean any surfaces that may have blood, body fluids, and/or secretions or excretions on them. Wear gloves when cleaning surfaces the  patient has come in contact with. Use a diluted bleach solution (e.g., dilute bleach with 1 part bleach and 10 parts water) or a household disinfectant with a label that says EPA-registered for coronaviruses. To make a bleach solution at home, add 1 tablespoon of bleach to 1 quart (4 cups) of water. For a larger supply, add  cup of bleach to 1 gallon (16 cups) of water. Read labels of cleaning products and follow recommendations provided on product labels. Labels contain instructions for safe and effective use of the cleaning product including precautions you should take when applying the product, such as wearing gloves or eye protection and making sure you have good ventilation during use of the product. Remove gloves and wash hands immediately after  cleaning.  Monitor yourself for signs and symptoms of illness Caregivers and household members are considered close contacts, should monitor their health, and will be asked to limit movement outside of the home to the extent possible. Follow the monitoring steps for close contacts listed on the symptom monitoring form.   If you have additional questions, contact your local health department or call the epidemiologist on call at [phone] (available 24/7). This guidance is subject to change. For the most up-to-date guidance from CDC, please refer to their website: TripMetro.huhttps://www.cdc.gov/coronavirus/2019-ncov/hcp/guidance-prevent-spread.html   09/07/18 1142    Diet - low sodium heart healthy     09/07/18 1142    Temperature monitoring     09/07/18 1142    acetaminophen (TYLENOL) 500 MG tablet  Every 8 hours PRN     09/07/18 [address]           Lisa Zachow S, PA-C 09/13/18 1631    Jacalyn LefevreHaviland, Julie, MD 09/15/18 [phone]

## 2018-09-05 NOTE — ED Notes (Signed)
ED TO INPATIENT HANDOFF REPORT  ED Nurse Name and Phone #: [phone]  S Name/Age/Gender Lisa Foley 48 y.o. female Room/Bed: 011C/011C  Code Status   Code Status: Full Code  Home/SNF/Other Home Patient oriented to: self, place, time and situation Is this baseline? Yes   Triage Complete: Triage complete  Chief Complaint Lupus, Covid-19 Symtoms  Triage Note Patient complains of shortness of breath,  slight headache, productive cough and diarrhea. Tested positive for coronavirus on Thursday. Has lupus.    Allergies Allergies  Allergen Reactions  . Aleve [Naproxen Sodium] Swelling    Level of Care/Admitting Diagnosis ED Disposition    ED Disposition Condition Comment   Admit  [facility] [[id number]]  Level of Care: Med-Surg [16]  Covid Evaluation: Confirmed COVID Positive  Isolation Risk Level: Low Risk/Droplet (Less than 4L  supplementation)  Diagnosis: Acute bronchitis due to COVID-19 virus [[id number]][[phone]]  Admitting Physician: Alessandra BevelsKAMINENI, NEELIMA [[id number]]  Attending Physician: Alessandra BevelsKAMINENI, NEELIMA [[id number]]  Estimated length of stay: past midnight tomorrow  Certification:: I certify this patient will need inpatient services for at least 2 midnights  PT Class (Do Not Modify): Inpatient [101]  PT Acc Code (Do Not Modify): Private [1]       B Medical/Surgery History Past Medical History:  Diagnosis Date  . Carpal tunnel syndrome   . Lupus [facility](HCC)    Past Surgical History:  Procedure Laterality Date  . TOTAL HIP ARTHROPLASTY    . TUBAL LIGATION       A IV Location/Drains/Wounds Patient Lines/Drains/Airways Status   Active Line/Drains/Airways    Name:   Placement date:   Placement time:   Site:   Days:   Peripheral IV 09/05/18 Right;Anterior Forearm   09/05/18    1853    Forearm   less than 1   Midline Single Lumen   -    -    -             Intake/Output Last 24 hours No intake or output data in the 24 hours ending 09/05/18  1911  Labs/Imaging Results for orders placed or performed during the hospital encounter of 09/05/18 (from the past 48 hour(s))  CBC with Differential     Status: Abnormal   Collection Time: 09/05/18  3:39 PM  Result Value Ref Range   WBC 4.6 4.0 - 10.5 K/uL   RBC 3.86 (L) 3.87 - 5.11 MIL/uL   Hemoglobin 11.5 (L) 12.0 - 15.0 g/dL   HCT 66.437.3 40.336.0 - 47.446.0 %   MCV 96.6 80.0 - 100.0 fL   MCH 29.8 26.0 - 34.0 pg   MCHC 30.8 30.0 - 36.0 g/dL   RDW 25.913.1 56.311.5 - 87.515.5 %   Platelets 253 150 - 400 K/uL   nRBC 0.0 0.0 - 0.2 %   Neutrophils Relative % 60 %   Neutro Abs 2.8 1.7 - 7.7 K/uL   Lymphocytes Relative 31 %   Lymphs Abs 1.4 0.7 - 4.0 K/uL   Monocytes Relative 7 %   Monocytes Absolute 0.3 0.1 - 1.0 K/uL   Eosinophils Relative 2 %   Eosinophils Absolute 0.1 0.0 - 0.5 K/uL   Basophils Relative 0 %   Basophils Absolute 0.0 0.0 - 0.1 K/uL   Immature Granulocytes 0 %   Abs Immature Granulocytes 0.01 0.00 - 0.07 K/uL    Comment: Performed at [facility], [address] [place], [place] [address]  Lactic acid, plasma     Status: Abnormal  Collection Time: 09/05/18  3:39 PM  Result Value Ref Range   Lactic Acid, Venous 2.4 (HH) 0.5 - 1.9 mmol/L    Comment: CRITICAL RESULT CALLED TO, READ BACK BY AND VERIFIED WITH: MEEKS, L RN @ 5784 ON 09/05/2018 BY TEMOCHE, H Performed at [facility], [place] [address] [place], [place] [address]   Comprehensive metabolic panel     Status: Abnormal   Collection Time: 09/05/18  3:39 PM  Result Value Ref Range   Sodium 137 135 - 145 mmol/L   Potassium 3.7 3.5 - 5.1 mmol/L   Chloride 103 98 - 111 mmol/L   CO2 25 22 - 32 mmol/L   Glucose, Bld 75 70 - 99 mg/dL   BUN <5 (L) 6 - 20 mg/dL   Creatinine, Ser 0.50 0.44 - 1.00 mg/dL   Calcium 9.1 8.9 - 10.3 mg/dL   Total Protein 9.7 (H) 6.5 - 8.1 g/dL   Albumin 3.1 (L) 3.5 - 5.0 g/dL   AST 56 (H) 15 - 41 U/L   ALT 47 (H) 0 - 44 U/L   Alkaline Phosphatase 55 38 - 126 U/L   Total Bilirubin 0.8  0.3 - 1.2 mg/dL   GFR calc non Af Amer >60 >60 mL/min   GFR calc Af Amer >60 >60 mL/min   Anion gap 9 5 - 15    Comment: Performed at [facility] [address] [place], [place] [address]  D-dimer, quantitative     Status: Abnormal   Collection Time: 09/05/18  3:39 PM  Result Value Ref Range   D-Dimer, Quant 2.14 (H) 0.00 - 0.50 ug/mL-FEU    Comment: (NOTE) At the manufacturer cut-off of 0.50 ug/mL FEU, this assay has been documented to exclude PE with a sensitivity and negative predictive value of 97 to 99%.  At this time, this assay has not been approved by the FDA to exclude DVT/VTE. Results should be correlated with clinical presentation. Performed at [facility], [place] [address] [place], [place] [address]   Lactate dehydrogenase     Status: Abnormal   Collection Time: 09/05/18  3:39 PM  Result Value Ref Range   LDH 285 (H) 98 - 192 U/L    Comment: Performed at [facility], [place] [address], [place], [place] [address]  Ferritin     Status: None   Collection Time: 09/05/18  3:39 PM  Result Value Ref Range   Ferritin 143 11 - 307 ng/mL    Comment: Performed at [facility], [place] [address], [place], [place] [address]  Triglycerides     Status: None   Collection Time: 09/05/18  3:39 PM  Result Value Ref Range   Triglycerides 124 <150 mg/dL    Comment: Performed at [facility] [address], [place], [place] [address]  Fibrinogen     Status: None   Collection Time: 09/05/18  3:39 PM  Result Value Ref Range   Fibrinogen 475 210 - 475 mg/dL    Comment: Performed at [facility] [address] [place], [place] [address]  C-reactive protein     Status: Abnormal   Collection Time: 09/05/18  3:39 PM  Result Value Ref Range   CRP 2.2 (H) <1.0 mg/dL    Comment: Performed at [facility], [place] [address], [place],  [address]  I-Stat Beta hCG blood, ED ([facility], [facility], [facility] only)     Status: None   Collection  Time: 09/05/18  3:53 PM   Result Value Ref Range   I-stat hCG, quantitative <5.0 <5 mIU/mL   Comment 3            Comment:   GEST. AGE      CONC.  (mIU/mL)   <=1 WEEK        5 - 50     2 WEEKS       50 - 500     3 WEEKS       100 - 10,000     4 WEEKS     1,000 - 30,000        FEMALE AND NON-PREGNANT FEMALE:     LESS THAN 5 mIU/mL    Dg Chest Portable 1 View  Result Date: 09/05/2018 CLINICAL DATA:  Cough and shortness of breath EXAM: PORTABLE CHEST 1 VIEW COMPARISON:  May 12, 2015 FINDINGS: There is scarring in the left mid lung. There is probable fibrosis in the right base, stable. There is no frank edema or consolidation. Heart is upper normal in size with pulmonary vascularity normal. No adenopathy. There is degenerative change in the thoracic spine. IMPRESSION: Scarring left mid lung, stable. Fibrotic change right base, stable. No frank edema or consolidation. Stable cardiac silhouette. No evident adenopathy. Electronically Signed   By: Bretta BangWilliam  Woodruff III M.D.   On: 09/05/2018 14:46    Pending Labs Unresulted Labs (From admission, onward)    Start     Ordered   09/12/18 0500  Creatinine, serum  (enoxaparin (LOVENOX)    CrCl >/= 30 ml/min)  Weekly,   R    Comments: while on enoxaparin therapy    09/05/18 1850   09/06/18 0500  CBC with Differential/Platelet  Daily,   R     09/05/18 1850   09/06/18 0500  Ferritin  Daily,   R     09/05/18 1850   09/06/18 0500  C-reactive protein  Daily,   R     09/05/18 1850   09/05/18 1847  HIV antibody (Routine Testing)  Once,   STAT     09/05/18 1850   09/05/18 1847  CBC  (enoxaparin (LOVENOX)    CrCl >/= 30 ml/min)  Once,   STAT    Comments: Baseline for enoxaparin therapy IF NOT ALREADY DRAWN.  Notify MD if PLT < 100 K.    09/05/18 1850   09/05/18 1847  Creatinine, serum  (enoxaparin (LOVENOX)    CrCl >/= 30 ml/min)  Once,   STAT    Comments: Baseline for enoxaparin therapy IF NOT ALREADY DRAWN.    09/05/18 1850   09/05/18 1447  Lactic acid, plasma  Now then  every 2 hours,   STAT     09/05/18 1447   09/05/18 1447  Blood Culture (routine x 2)  BLOOD CULTURE X 2,   STAT     09/05/18 1447   09/05/18 1447  Procalcitonin  ONCE - STAT,   STAT     09/05/18 1447          Vitals/Pain Today's Vitals   09/05/18 1500 09/05/18 1530 09/05/18 1547 09/05/18 1617  BP:  127/77    Pulse: 76 92    Resp: (!) 32 (!) 21    Temp:      TempSrc:      SpO2: 100% 98%    PainSc:   4  8     Isolation Precautions Airborne and Contact precautions  Medications Medications  AeroChamber Plus Flo-Vu Large  MISC 1 each (has no administration in time range)  oxyCODONE-acetaminophen (PERCOCET/ROXICET) 5-325 MG per tablet 1 tablet (has no administration in time range)  hydroxychloroquine (PLAQUENIL) tablet 600 mg (has no administration in time range)  benzonatate (TESSALON) capsule 100 mg (has no administration in time range)  mometasone-formoterol (DULERA) 200-5 MCG/ACT inhaler 2 puff (has no administration in time range)  Ipratropium-Albuterol (COMBIVENT) respimat 1 puff (has no administration in time range)  methylPREDNISolone sodium succinate (SOLU-MEDROL) 125 mg/2 mL injection 60 mg (has no administration in time range)  enoxaparin (LOVENOX) injection 40 mg (has no administration in time range)  ondansetron (ZOFRAN) tablet 4 mg (has no administration in time range)    Or  ondansetron (ZOFRAN) injection 4 mg (has no administration in time range)  acetaminophen (TYLENOL) tablet 650 mg (has no administration in time range)  albuterol (VENTOLIN HFA) 108 (90 Base) MCG/ACT inhaler 6 puff (6 puffs Inhalation Given 09/05/18 1453)  acetaminophen (TYLENOL) tablet 650 mg (650 mg Oral Given 09/05/18 1451)  metoCLOPramide (REGLAN) tablet 10 mg (10 mg Oral Given 09/05/18 1801)  diphenhydrAMINE (BENADRYL) capsule 25 mg (25 mg Oral Given 09/05/18 1801)    Mobility walks Low fall risk   Focused Assessments    R Recommendations: See Admitting Provider Note  Report given  to:   Additional Notes:

## 2018-09-05 NOTE — ED Notes (Signed)
Pt walked in room without O2 on and sats stayed between 98-100, pt very short of breath while walking and while sitting back down.

## 2018-09-06 ENCOUNTER — Inpatient Hospital Stay (HOSPITAL_COMMUNITY): Payer: Medicare Other

## 2018-09-06 LAB — COMPREHENSIVE METABOLIC PANEL
ALT: 43 U/L (ref 0–44)
AST: 54 U/L — ABNORMAL HIGH (ref 15–41)
Albumin: 3.3 g/dL — ABNORMAL LOW (ref 3.5–5.0)
Alkaline Phosphatase: 54 U/L (ref 38–126)
Anion gap: 11 (ref 5–15)
BUN: 7 mg/dL (ref 6–20)
CO2: 24 mmol/L (ref 22–32)
Calcium: 8.8 mg/dL — ABNORMAL LOW (ref 8.9–10.3)
Chloride: 102 mmol/L (ref 98–111)
Creatinine, Ser: 0.49 mg/dL (ref 0.44–1.00)
GFR calc Af Amer: >60 mL/min (ref >60)
GFR calc non Af Amer: >60 mL/min (ref >60)
Glucose, Bld: 74 mg/dL (ref 70–99)
Potassium: 4.3 mmol/L (ref 3.5–5.1)
Sodium: 137 mmol/L (ref 135–145)
Total Bilirubin: 0.5 mg/dL (ref 0.3–1.2)
Total Protein: 9.2 g/dL — ABNORMAL HIGH (ref 6.5–8.1)

## 2018-09-06 LAB — CBC WITH DIFFERENTIAL/PLATELET
Abs Immature Granulocytes: 0.02 10*3/uL (ref 0.00–0.07)
Basophils Absolute: 0 10*3/uL (ref 0.0–0.1)
Basophils Relative: 0 %
Eosinophils Absolute: 0.1 10*3/uL (ref 0.0–0.5)
Eosinophils Relative: 2 %
HCT: 38.1 % (ref 36.0–46.0)
Hemoglobin: 11.6 g/dL — ABNORMAL LOW (ref 12.0–15.0)
Immature Granulocytes: 0 %
Lymphocytes Relative: 21 %
Lymphs Abs: 1.2 10*3/uL (ref 0.7–4.0)
MCH: 29.7 pg (ref 26.0–34.0)
MCHC: 30.4 g/dL (ref 30.0–36.0)
MCV: 97.4 fL (ref 80.0–100.0)
Monocytes Absolute: 0.4 10*3/uL (ref 0.1–1.0)
Monocytes Relative: 6 %
Neutro Abs: 3.8 10*3/uL (ref 1.7–7.7)
Neutrophils Relative %: 71 %
Platelets: 224 10*3/uL (ref 150–400)
RBC: 3.91 MIL/uL (ref 3.87–5.11)
RDW: 13.2 % (ref 11.5–15.5)
WBC: 5.5 10*3/uL (ref 4.0–10.5)
nRBC: 0 % (ref 0.0–0.2)

## 2018-09-06 LAB — FERRITIN: Ferritin: 141 ng/mL (ref 11–307)

## 2018-09-06 LAB — C-REACTIVE PROTEIN: CRP: 2.1 mg/dL — ABNORMAL HIGH (ref <1.0)

## 2018-09-06 LAB — D-DIMER, QUANTITATIVE: D-Dimer, Quant: >20 ug/mL-FEU — ABNORMAL HIGH (ref 0.00–0.50)

## 2018-09-06 LAB — PHOSPHORUS: Phosphorus: 3.9 mg/dL (ref 2.5–4.6)

## 2018-09-06 LAB — MAGNESIUM: Magnesium: 1.7 mg/dL (ref 1.7–2.4)

## 2018-09-06 LAB — ACETAMINOPHEN LEVEL: Acetaminophen (Tylenol), Serum: <10 ug/mL — ABNORMAL LOW (ref 10–30)

## 2018-09-06 LAB — HIV ANTIBODY (ROUTINE TESTING W REFLEX): HIV Screen 4th Generation wRfx: NONREACTIVE

## 2018-09-06 MED ORDER — ENOXAPARIN SODIUM 40 MG/0.4ML ~~LOC~~ SOLN
40.0000 mg | Freq: Two times a day (BID) | SUBCUTANEOUS | Status: DC
  Administered 2018-09-06 – 2018-09-07 (×3): 40 mg via SUBCUTANEOUS
  Filled 2018-09-06 (×3): qty 0.4

## 2018-09-06 NOTE — Progress Notes (Signed)
PROGRESS NOTE  Lisa Foley  ZOX:[id number] DOB: Jun 16, 1970 DOA: 09/05/2018 PCP: Patient, No Pcp Per   Brief Narrative: Lisa Foley is a 48 y.o. female with a history of SLE currently not on plaquenil or prednisone (transitioning between providers), [name], fatigue, headache, myalgias, [name], d-dimer 2.14, and LFTs mildly elevated. [facility] and started on steroids. She was liberated from oxygen but continues to feel unwell and have dyspnea. D-dimer rose to >20 the next day, LE dopplers pending.   Assessment & Plan: Principal Problem:   Acute bronchitis due to COVID-19 virus Active Problems:   Lupus (HCC)   Nicotine dependence, cigarettes, uncomplicated   Osteoarthritis of right hip   Gastroenteritis due to COVID-19 virus  Acute bronchitis with respiratory distress due to COVID-19 pneumonia:  - Continue airborne, contact precautions. PPE including surgical gown, gloves, cap, shoe covers, and CAPR used during this encounter in a negative pressure room.  - Check daily labs: CBC w/diff, CMP, d-dimer, ferritin, LDH, CRP - Enoxaparin ~ 0.5mg /kg q12h intermediate dose. D-dimer above testing limits.  - Blood cultures drawn.  - Maintain euvolemia/net negative.  - Avoid NSAIDs - Recommend proning and aggressive use of incentive spirometry. - Not hypoxic so not starting remdesivir.   Elevated d-dimer. Rose from 2 to >20 in 24 hours though no clinical evidence of DVT, this will be ruled out. Doubt PE with such hemodynamic stability and no hypoxia.  - LE U/S - Trend - Intermediate lovenox dosing as above.  Acute  gastroenteritis: due to covid-19 infection.  - Push per oral hydration.  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  - Avoid NSAIDs as able. - Up with PT  Tobacco dependence:  - Cessation counseling provided.   Obesity: BMI 33.  - Weight loss recommended long term.  DVT prophylaxis: Intermediate dose lovenox Code Status: Full Family Communication: None at bedside Disposition Plan: Uncertain  Consultants:   None  Procedures:   None  Antimicrobials:  None   Subjective: Feels generally ill, fatigued, pounding headache. Denies shortness of breath. No chest pain. Some cough. No leg swelling, leg cramping/pain, and no history of blood clots.   Objective: Vitals:   09/05/18 1830 09/05/18 2116 09/06/18 0253 09/06/18 0802  BP: 116/82 106/62 118/68 116/68  Pulse: 71 78 85 81  Resp: (!) 22 (!) 22 (!) 22 (!) 23  Temp:  98.2 F (36.8 C) 98.1 F (36.7 C) 99 F (37.2 C)  TempSrc:  Oral Oral Oral  SpO2: 100% 100% 100% 95%  Weight:  96.7 kg    Height:  5\' 7"  (1.702 m)      Intake/Output Summary (Last 24 hours) at 09/06/2018 1341 Last data filed at 09/06/2018 1000 Gross per 24 hour  Intake 240 ml  Output 400 ml  Net -160 ml   Filed Weights   09/05/18 2116  Weight: 96.7 kg    Gen: 48 y.o. female in no distress Pulm: Non-labored breathing room air. Clear  to auscultation bilaterally.  CV: Regular rate and rhythm. No murmur, rub, or gallop. No JVD, no pedal edema. GI: Abdomen soft, non-tender, non-distended, with normoactive bowel sounds. No organomegaly or masses felt. Ext: Warm, no deformities Skin: No rashes, lesions or ulcers Neuro: Alert and oriented. No focal neurological deficits. Psych: Judgement and insight appear normal. Mood & affect appropriate.   Data  Reviewed: I have personally reviewed following labs and imaging studies  CBC: Recent Labs  Lab 09/05/18 1539 09/05/18 1951 09/06/18 0250  WBC 4.6 4.5 5.5  NEUTROABS 2.8  --  3.8  HGB 11.5* 11.3* 11.6*  HCT 37.3 36.5 38.1  MCV 96.6 96.3 97.4  PLT 253 235 224   Basic Metabolic Panel: Recent Labs  Lab 09/05/18 1539 09/05/18 1951 09/06/18 0250  NA 137  --  137  K 3.7  --  4.3  CL 103  --  102  CO2 25  --  24  GLUCOSE 75  --  74  BUN <5*  --  7  CREATININE 0.50 0.45 0.49  CALCIUM 9.1  --  8.8*  MG  --   --  1.7  PHOS  --   --  3.9   GFR: Estimated Creatinine Clearance: 102.6 mL/min (by C-G formula based on SCr of 0.49 mg/dL). Liver Function Tests: Recent Labs  Lab 09/05/18 1539 09/06/18 0250  AST 56* 54*  ALT 47* 43  ALKPHOS 55 54  BILITOT 0.8 0.5  PROT 9.7* 9.2*  ALBUMIN 3.1* 3.3*   No results for input(s): LIPASE, AMYLASE in the last 168 hours. No results for input(s): AMMONIA in the last 168 hours. Coagulation Profile: No results for input(s): INR, PROTIME in the last 168 hours. Cardiac Enzymes: No results for input(s): CKTOTAL, CKMB, CKMBINDEX, TROPONINI in the last 168 hours. BNP (last 3 results) No results for input(s): PROBNP in the last 8760 hours. HbA1C: No results for input(s): HGBA1C in the last 72 hours. CBG: No results for input(s): GLUCAP in the last 168 hours. Lipid Profile: Recent Labs    09/05/18 1539  TRIG 124   Thyroid Function Tests: No results for input(s): TSH, T4TOTAL, FREET4, T3FREE, THYROIDAB in the last 72 hours. Anemia Panel: Recent Labs    09/05/18 1539 09/06/18 0250  FERRITIN 143 141   Urine analysis:    Component Value Date/Time   COLORURINE YELLOW 11/14/2014 2119   APPEARANCEUR CLEAR 11/14/2014 2119   LABSPEC 1.022 11/14/2014 2119   PHURINE 5.0 11/14/2014 2119   GLUCOSEU NEGATIVE 11/14/2014 2119   HGBUR NEGATIVE 11/14/2014 2119   BILIRUBINUR NEGATIVE 11/14/2014 2119   KETONESUR NEGATIVE 11/14/2014 2119    PROTEINUR NEGATIVE 11/14/2014 2119   UROBILINOGEN 1.0 11/14/2014 2119   NITRITE NEGATIVE 11/14/2014 2119   LEUKOCYTESUR NEGATIVE 11/14/2014 2119   Recent Results (from the past 240 hour(s))  Blood Culture (routine x 2)     Status: None (Preliminary result)   Collection Time: 09/05/18  3:40 PM   Specimen: BLOOD LEFT HAND  Result Value Ref Range Status   Specimen Description BLOOD LEFT HAND  Final   Special Requests   Final    BOTTLES DRAWN AEROBIC AND ANAEROBIC Blood Culture results may not be optimal due to an inadequate volume of blood received in culture bottles   Culture   Final    NO GROWTH < 24 HOURS Performed at [facility], [address] [place], [place] [address]    Report Status PENDING  Incomplete  Blood Culture (routine x 2)  Status: None (Preliminary result)   Collection Time: 09/05/18  3:46 PM   Specimen: BLOOD RIGHT HAND  Result Value Ref Range Status   Specimen Description BLOOD RIGHT HAND  Final   Special Requests   Final    BOTTLES DRAWN AEROBIC AND ANAEROBIC Blood Culture results may not be optimal due to an inadequate volume of blood received in culture bottles   Culture   Final    NO GROWTH < 24 HOURS Performed at [facility], [place] [address] [place], [place] [address]    Report Status PENDING  Incomplete      Radiology Studies: Dg Chest Port 1 View  Result Date: 09/06/2018 CLINICAL DATA:  COVID-19 EXAM: PORTABLE CHEST 1 VIEW COMPARISON:  Yesterday FINDINGS: Worsening patchy bilateral infiltrate. There is chronic lung disease with scarring based on 2017 chest x-ray. Normal heart size and stable mediastinal contours. No effusion or pneumothorax. IMPRESSION: Chronic lung disease with patchy bilateral infiltrate that has increased from yesterday. Electronically Signed   By: Monte Fantasia M.D.   On: 09/06/2018 04:54   Dg Chest Portable 1 View  Result Date: 09/05/2018 CLINICAL DATA:  Cough and shortness of breath EXAM: PORTABLE CHEST 1 VIEW  COMPARISON:  May 12, 2015 FINDINGS: There is scarring in the left mid lung. There is probable fibrosis in the right base, stable. There is no frank edema or consolidation. Heart is upper normal in size with pulmonary vascularity normal. No adenopathy. There is degenerative change in the thoracic spine. IMPRESSION: Scarring left mid lung, stable. Fibrotic change right base, stable. No frank edema or consolidation. Stable cardiac silhouette. No evident adenopathy. Electronically Signed   By: Lowella Grip III M.D.   On: 09/05/2018 14:46    Scheduled Meds: . AeroChamber Plus Flo-Vu Large  1 each Other Once  . benzonatate  100 mg Oral Q8H  . enoxaparin (LOVENOX) injection  40 mg Subcutaneous Q12H  . guaiFENesin  600 mg Oral BID  . methylPREDNISolone (SOLU-MEDROL) injection  60 mg Intravenous Q12H  . mometasone-formoterol  2 puff Inhalation BID   Continuous Infusions:   LOS: 1 day   Time spent: 35 minutes.  Patrecia Pour, MD [facility] [email or web address] Password [email or web address] 09/06/2018, 1:41 PM

## 2018-09-07 LAB — CBC WITH DIFFERENTIAL/PLATELET
Abs Immature Granulocytes: 0.06 10*3/uL (ref 0.00–0.07)
Basophils Absolute: 0 10*3/uL (ref 0.0–0.1)
Basophils Relative: 0 %
Eosinophils Absolute: 0 10*3/uL (ref 0.0–0.5)
Eosinophils Relative: 0 %
HCT: 35 % — ABNORMAL LOW (ref 36.0–46.0)
Hemoglobin: 10.9 g/dL — ABNORMAL LOW (ref 12.0–15.0)
Immature Granulocytes: 1 %
Lymphocytes Relative: 12 %
Lymphs Abs: 0.8 10*3/uL (ref 0.7–4.0)
MCH: 29.7 pg (ref 26.0–34.0)
MCHC: 31.1 g/dL (ref 30.0–36.0)
MCV: 95.4 fL (ref 80.0–100.0)
Monocytes Absolute: 0.3 10*3/uL (ref 0.1–1.0)
Monocytes Relative: 4 %
Neutro Abs: 6 10*3/uL (ref 1.7–7.7)
Neutrophils Relative %: 83 %
Platelets: 293 10*3/uL (ref 150–400)
RBC: 3.67 MIL/uL — ABNORMAL LOW (ref 3.87–5.11)
RDW: 12.8 % (ref 11.5–15.5)
WBC: 7.2 10*3/uL (ref 4.0–10.5)
nRBC: 0 % (ref 0.0–0.2)

## 2018-09-07 LAB — COMPREHENSIVE METABOLIC PANEL
ALT: 38 U/L (ref 0–44)
AST: 37 U/L (ref 15–41)
Albumin: 3 g/dL — ABNORMAL LOW (ref 3.5–5.0)
Alkaline Phosphatase: 53 U/L (ref 38–126)
Anion gap: 11 (ref 5–15)
BUN: 12 mg/dL (ref 6–20)
CO2: 22 mmol/L (ref 22–32)
Calcium: 9.5 mg/dL (ref 8.9–10.3)
Chloride: 102 mmol/L (ref 98–111)
Creatinine, Ser: 0.44 mg/dL (ref 0.44–1.00)
GFR calc Af Amer: >60 mL/min (ref >60)
GFR calc non Af Amer: >60 mL/min (ref >60)
Glucose, Bld: 170 mg/dL — ABNORMAL HIGH (ref 70–99)
Potassium: 3.9 mmol/L (ref 3.5–5.1)
Sodium: 135 mmol/L (ref 135–145)
Total Bilirubin: 0.3 mg/dL (ref 0.3–1.2)
Total Protein: 9.3 g/dL — ABNORMAL HIGH (ref 6.5–8.1)

## 2018-09-07 LAB — D-DIMER, QUANTITATIVE: D-Dimer, Quant: 1.3 ug/mL-FEU — ABNORMAL HIGH (ref 0.00–0.50)

## 2018-09-07 LAB — C-REACTIVE PROTEIN: CRP: 2.8 mg/dL — ABNORMAL HIGH (ref <1.0)

## 2018-09-07 LAB — FERRITIN: Ferritin: 131 ng/mL (ref 11–307)

## 2018-09-07 LAB — MAGNESIUM: Magnesium: 1.7 mg/dL (ref 1.7–2.4)

## 2018-09-07 LAB — PHOSPHORUS: Phosphorus: 4 mg/dL (ref 2.5–4.6)

## 2018-09-07 MED ORDER — BUDESONIDE-FORMOTEROL FUMARATE 160-4.5 MCG/ACT IN AERO: 2.0000 | INHALATION_SPRAY | Freq: Two times a day (BID) | RESPIRATORY_TRACT | Status: AC

## 2018-09-07 MED ORDER — ACETAMINOPHEN 500 MG PO TABS: 1000.0000 mg | ORAL_TABLET | Freq: Three times a day (TID) | ORAL | 0 refills | Status: AC | PRN

## 2018-09-07 MED ORDER — PREDNISONE 20 MG PO TABS: ORAL_TABLET | ORAL | 0 refills | Status: AC

## 2018-09-07 NOTE — Care Management (Signed)
Case manager scheduled a telephonic follow up visit for patient on Thursday, September 16, 2018 at 9:50am with Freeman Caldron, NP, [facility].

## 2018-09-07 NOTE — Evaluation (Signed)
Physical Therapy Evaluation Patient Details Name: Lisa Foley MRN: [id number] DOB: April 14, 1970 Today's Date: 09/07/2018   History of Present Illness  48 y.o. female with a history of SLE , OA s/p right THA who presented to the ED 09/05/18 with worsening cough, fatigue, headache, myalgias, and shortness of breath despite supportive measures at home after screening positive for covid on 6/22 (all employees tested at [employer] due to positive coworker). D-dimer rose to >20 the next day, LE dopplers pending and then order cancelled    Clinical Impression  Patient evaluated by Physical Therapy with no further acute PT needs identified. All education has been completed and the patient has no further questions. Ambulated 500 ft on room air with SaO2 93-97% and HR 80-101 with no incr RR. PT is signing off. Thank you for this referral.     Follow Up Recommendations No PT follow up    Equipment Recommendations  None recommended by PT    Recommendations for Other Services       Precautions / Restrictions Precautions Precautions: None      Mobility  Bed Mobility Overal bed mobility: Independent                Transfers Overall transfer level: Independent                  Ambulation/Gait Ambulation/Gait assistance: Independent Gait Distance (Feet): 500 Feet Assistive device: None Gait Pattern/deviations: WFL(Within Functional Limits)     General Gait Details: no incr RR; all VSS  Stairs            Wheelchair Mobility    Modified Rankin (Stroke Patients Only)       Balance Overall balance assessment: Independent                                           Pertinent Vitals/Pain Pain Assessment: No/denies pain    Home Living Family/patient expects to be discharged to:: Private residence Living Arrangements: Children(sons, grandkids) Available Help at Discharge: Family;Available PRN/intermittently Type of Home: House Home Access: Stairs  to enter Entrance Stairs-Rails: Psychiatric nurse of Steps: 8 Home Layout: One level Home Equipment: None      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     Hand Dominance        Extremity/Trunk Assessment   Upper Extremity Assessment Upper Extremity Assessment: Overall WFL for tasks assessed    Lower Extremity Assessment Lower Extremity Assessment: Overall WFL for tasks assessed    Cervical / Trunk Assessment Cervical / Trunk Assessment: Normal  Communication   Communication: No difficulties  Cognition Arousal/Alertness: Awake/alert Behavior During Therapy: WFL for tasks assessed/performed Overall Cognitive Status: Within Functional Limits for tasks assessed                                        General Comments General comments (skin integrity, edema, etc.): pt is planning to d/c home today. Reinforced need to quarantine from her family (one member of household tested and negative). Multiple questions re: return to work (?place of pt exposure)--MD addressed on his arrival    Exercises     Assessment/Plan    PT Assessment Patent does not need any further PT services  PT Problem List  PT Treatment Interventions      PT Goals (Current goals can be found in the Care Plan section)  Acute Rehab PT Goals Patient Stated Goal: go home to recover PT Goal Formulation: All assessment and education complete, DC therapy    Frequency     Barriers to discharge        Co-evaluation               AM-PAC PT "6 Clicks" Mobility  Outcome Measure Help needed turning from your back to your side while in a flat bed without using bedrails?: None Help needed moving from lying on your back to sitting on the side of a flat bed without using bedrails?: None Help needed moving to and from a bed to a chair (including a wheelchair)?: None Help needed standing up from a chair using your arms  (e.g., wheelchair or bedside chair)?: None Help needed to walk in hospital room?: None Help needed climbing 3-5 steps with a railing? : None 6 Click Score: 24    End of Session   Activity Tolerance: Patient tolerated treatment well Patient left: in chair;with call bell/phone within reach   PT Visit Diagnosis: Other abnormalities of gait and mobility (R26.89)    Time: [id number] PT Time Calculation (min) (ACUTE ONLY): 29 min   Charges:   PT Evaluation $PT Eval Low Complexity: 1 Low PT Treatments $Self Care/Home Management: 8-22          Zena AmosLynn P Terrall Bley, PT 09/07/2018, 9:40 AM

## 2018-09-07 NOTE — TOC Transition Note (Signed)
Transition of Care [facility]) - CM/SW Discharge Note   Patient Details  Name: Lavetta Geier MRN: [id number] Date of Birth: 03/06/1971  Transition of Care [facility]) CM/SW Contact:  Ninfa Meeker, RN Phone Number: [phone] (working remotely) 09/07/2018, 1:27 PM   Clinical Narrative:   48 yr old female admitted and treated for COVID 19. Thankfully, she is improving and able to discharge home. Patient says her son and his family live with her. Patient says she used to go to [place] But hasn't in a long time. Case manager explained to patient that a follow up telephone appointment will be setup for her. Case Manager will contact [place] to get patient an appointment. Will notify patient should she discharge prior to appointment being scheduled.   Final next level of care: Home/Self Care Barriers to Discharge: No Barriers Identified   Patient Goals and CMS Choice        Discharge Placement: Home /self care                       Discharge Plan and Services   Discharge Planning Services: CM Consult, Follow-up appt scheduled(case manager will contact patient after appointment arranged) Post Acute Care Choice: NA          DME Arranged: N/A DME Agency: NA       HH Arranged: NA          Social Determinants of Health (SDOH) Interventions     Readmission Risk Interventions No flowsheet data found.

## 2018-09-07 NOTE — Discharge Summary (Signed)
Physician Discharge Summary  Lisa Foley ZOX:[id number] DOB: 04-20-1970 DOA: 09/05/2018  PCP: Patient, No Pcp Per  Admit date: 09/05/2018 Discharge date: 09/07/2018  Admitted From: Home Disposition: Home   Recommendations for Outpatient Follow-up:  1. Follow up with PCP in 1-2 weeks 2. Establish care with rheumatology as soon as possible  Home Health: None Equipment/Devices: None Discharge Condition: Stable CODE STATUS: Full Diet recommendation: Heart healthy  Brief/Interim Summary: Lisa Foley is a 48 y.o. female with a history of SLE currently not on plaquenil or prednisone (transitioning between providers), [name], fatigue, headache, myalgias, [name], d-dimer 2.14, and LFTs mildly elevated. She was admitted to [facility] 6/28 and started on steroids. She was subsequently liberated from oxygen and feels well at time of discharge.  Discharge Diagnoses:  Principal Problem:   Acute bronchitis due to COVID-19 virus Active Problems:   Lupus (HCC)   Nicotine dependence, cigarettes, uncomplicated   Osteoarthritis of right hip   Gastroenteritis due to COVID-19 virus  Acutebronchitis with respiratory distress due toCOVID-19 pneumonia: - Continue isolation x2 weeks - Steroid taper  Elevated d-dimer: Rose from 2 to >20 in 24 hours, then back to ~1 (slightly elevation expected due to covid), so suspect this was a spurious result. No clinical evidence of DVT. Doubt PE with such hemodynamic stability and no hypoxia.  - No further work up indicated.  Acute gastroenteritis: due to covid-19 infection.  - Push per oral hydration.  Symptomatically improved.  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  - Avoid NSAIDs as able.  Tobacco dependence:  - Cessation counseling provided.   Obesity: BMI 33.  - Weight loss recommended long term.  Discharge Instructions Discharge Instructions    Diet - low sodium heart healthy   Complete by: As directed    Discharge instructions   Complete by: As directed    You are being discharged from the hospital after treatment for covid-19 infection. You doe not require oxygen or further evaluation.  - Please continue taking steroids by following the directions tapering prednisone over the next 13 days. This prescription was sent to your pharmacy.  - Remain in isolation for 2 weeks.  - Do not take NSAID medications (including, but not limited to, ibuprofen, advil, motrin, naproxen, aleve, goody's powder, etc.) - Follow up with your doctor in the next week via telehealth or seek medical attention right away if your symptoms get WORSE.  - Consider donating plasma after you have recovered (either 14 days after a negative test or 28 days after symptoms have completely resolved) because your antibodies to this virus may be helpful to give to others with life-threatening infections. Please go to the website www.[email or web address] if you would like to consider volunteering for plasma donation.    Directions for you at home:  Wear a facemask You should wear a facemask that covers your nose and mouth when you are in the same room with other people and when you visit a healthcare provider. People who live with or visit you  should also wear a facemask while they are in the same room with you.  Separate yourself from other people in your home As much as possible, you should stay in a different room from other people in  your home. Also, you should use a separate bathroom, if available.  Avoid sharing household items You should not share dishes, drinking glasses, cups, eating utensils, towels, bedding, or other items with other people in your home. After using these items, you should wash them thoroughly with soap and water.  Cover your coughs and sneezes Cover your mouth and nose with a tissue when you cough or sneeze, or you can cough or sneeze into your sleeve. Throw used tissues in a lined trash can, and immediately wash your hands with soap and water for at least 20 seconds or use an alcohol-based hand rub.  Wash your Union Pacific Corporationhands Wash your hands often and thoroughly with soap and water for at least 20 seconds. You can use an alcohol-based hand sanitizer if soap and water are not available and if your hands are not visibly dirty. Avoid touching your eyes, nose, and mouth with unwashed hands.  Directions for those who live with, or provide care at home for you:  Limit the number of people who have contact with the patient If possible, have only one caregiver for the patient. Other household members should stay in another home or place of residence. If this is not possible, they should stay in another room, or be separated from the patient as much as possible. Use a separate bathroom, if available. Restrict visitors who do not have an essential need to be in the home.  Ensure good ventilation Make sure that shared spaces in the home have good air flow, such as from an air conditioner or an opened window, weather permitting.  Wash your hands often Wash your hands often and thoroughly with soap and water for at least 20 seconds. You can use an alcohol based hand sanitizer if soap and water are not available and if your hands are not visibly dirty. Avoid touching your eyes, nose, and mouth with unwashed hands. Use disposable paper towels to dry your hands. If not available, use dedicated cloth towels and  replace them when they become wet.  Wear a facemask and gloves Wear a disposable facemask at all times in the room and gloves when you touch or have contact with the patient's blood, body fluids, and/or secretions or excretions, such as sweat, saliva, sputum, nasal mucus, vomit, urine, or feces.  Ensure the mask fits over your nose and mouth tightly, and do not touch it during use. Throw out disposable facemasks and gloves after using them. Do not reuse. Wash your hands immediately after removing your facemask and gloves. If your personal clothing becomes contaminated, carefully remove clothing and launder. Wash your hands after handling contaminated clothing. Place all used disposable facemasks, gloves, and other waste in a lined container before disposing them with other household waste. Remove gloves and wash your hands immediately after handling these items.  Do not share dishes, glasses, or other household items with the patient Avoid sharing household items. You should not share dishes, drinking glasses, cups, eating utensils, towels, bedding, or other items with a patient who is confirmed to have, or being evaluated for, COVID-19 infection. After the person uses these items, you should wash them thoroughly with soap and water.  Wash laundry thoroughly Immediately remove and wash clothes or bedding that have blood, body fluids, and/or  secretions or excretions, such as sweat, saliva, sputum, nasal mucus, vomit, urine, or feces, on them. Wear gloves when handling laundry from the patient. Read and follow directions on labels of laundry or clothing items and detergent. In general, wash and dry with the warmest temperatures recommended on the label.  Clean all areas the individual has used often Clean all touchable surfaces, such as counters, tabletops, doorknobs, bathroom fixtures, toilets, phones, keyboards, tablets, and bedside tables, every day. Also, clean any surfaces that may have blood,  body fluids, and/or secretions or excretions on them. Wear gloves when cleaning surfaces the patient has come in contact with. Use a diluted bleach solution (e.g., dilute bleach with 1 part bleach and 10 parts water) or a household disinfectant with a label that says EPA-registered for coronaviruses. To make a bleach solution at home, add 1 tablespoon of bleach to 1 quart (4 cups) of water. For a larger supply, add  cup of bleach to 1 gallon (16 cups) of water. Read labels of cleaning products and follow recommendations provided on product labels. Labels contain instructions for safe and effective use of the cleaning product including precautions you should take when applying the product, such as wearing gloves or eye protection and making sure you have good ventilation during use of the product. Remove gloves and wash hands immediately after cleaning.  Monitor yourself for signs and symptoms of illness Caregivers and household members are considered close contacts, should monitor their health, and will be asked to limit movement outside of the home to the extent possible. Follow the monitoring steps for close contacts listed on the symptom monitoring form.   If you have additional questions, contact your local health department or call the epidemiologist on call at [phone] (available 24/7). This guidance is subject to change. For the most up-to-date guidance from [facility], please refer to their website: TripMetro.huhttps://www.cdc.gov/coronavirus/2019-ncov/hcp/guidance-prevent-spread.html   Increase activity slowly   Complete by: As directed    MyChart COVID-19 home monitoring program   Complete by: Sep 07, 2018    Is the patient willing to use the MyChart Mobile App for home monitoring?: Yes   Temperature monitoring   Complete by: Sep 07, 2018    After how many days would you like to receive a notification of this patient's flowsheet entries?: 1     Allergies as of 09/07/2018      Reactions   Aleve  [naproxen Sodium] Swelling      Medication List    STOP taking these medications   hydroxychloroquine 200 MG tablet Commonly known as: PLAQUENIL     TAKE these medications   acetaminophen 500 MG tablet Commonly known as: TYLENOL Take 2 tablets (1,000 mg total) by mouth every 8 (eight) hours as needed for mild pain. Do NOT exceed 4,000mg  in 24 hours What changed:   how much to take  when to take this  additional instructions   budesonide-formoterol 160-4.5 MCG/ACT inhaler Commonly known as: Symbicort Inhale 2 puffs into the lungs 2 (two) times daily.   predniSONE 20 MG tablet Commonly known as: DELTASONE Take 3 tablets (60 mg total) by mouth daily for 3 days, THEN 2 tablets (40 mg total) daily for 3 days, THEN 1 tablet (20 mg total) daily for 3 days, THEN 0.5 tablets (10 mg total) daily for 4 days. Start taking on: September 07, 2018       Allergies  Allergen Reactions  . Aleve [Naproxen Sodium] Swelling    Consultations:  None  Procedures/Studies: Dg  Chest Port 1 View  Result Date: 09/06/2018 CLINICAL DATA:  COVID-19 EXAM: PORTABLE CHEST 1 VIEW COMPARISON:  Yesterday FINDINGS: Worsening patchy bilateral infiltrate. There is chronic lung disease with scarring based on 2017 chest x-ray. Normal heart size and stable mediastinal contours. No effusion or pneumothorax. IMPRESSION: Chronic lung disease with patchy bilateral infiltrate that has increased from yesterday. Electronically Signed   By: Marnee SpringJonathon  Watts M.D.   On: 09/06/2018 04:54   Dg Chest Portable 1 View  Result Date: 09/05/2018 CLINICAL DATA:  Cough and shortness of breath EXAM: PORTABLE CHEST 1 VIEW COMPARISON:  May 12, 2015 FINDINGS: There is scarring in the left mid lung. There is probable fibrosis in the right base, stable. There is no frank edema or consolidation. Heart is upper normal in size with pulmonary vascularity normal. No adenopathy. There is degenerative change in the thoracic spine. IMPRESSION:  Scarring left mid lung, stable. Fibrotic change right base, stable. No frank edema or consolidation. Stable cardiac silhouette. No evident adenopathy. Electronically Signed   By: Bretta BangWilliam  Woodruff III M.D.   On: 09/05/2018 14:46     Subjective: Feels well, still some intermittent headache without vision changes/nausea/vomiting. Walked with PT without hypoxia, ready to go home.  Discharge Exam: Vitals:   09/06/18 1941 09/07/18 0826  BP: 128/70 123/69  Pulse: 86   Resp: (!) 29   Temp: 98 F (36.7 C) (!) 97.5 F (36.4 C)  SpO2: 96% 97%   General: Pt is alert, awake, not in acute distress Cardiovascular: RRR, S1/S2 +, no rubs, no gallops Respiratory: CTA bilaterally, no wheezing, no rhonchi Abdominal: Soft, NT, ND, bowel sounds + Extremities: No edema, no cyanosis  Labs: BNP (last 3 results) No results for input(s): BNP in the last 8760 hours. Basic Metabolic Panel: Recent Labs  Lab 09/05/18 1539 09/05/18 1951 09/06/18 0250 09/07/18 0332  NA 137  --  137 135  K 3.7  --  4.3 3.9  CL 103  --  102 102  CO2 25  --  24 22  GLUCOSE 75  --  74 170*  BUN <5*  --  7 12  CREATININE 0.50 0.45 0.49 0.44  CALCIUM 9.1  --  8.8* 9.5  MG  --   --  1.7 1.7  PHOS  --   --  3.9 4.0   Liver Function Tests: Recent Labs  Lab 09/05/18 1539 09/06/18 0250 09/07/18 0332  AST 56* 54* 37  ALT 47* 43 38  ALKPHOS 55 54 53  BILITOT 0.8 0.5 0.3  PROT 9.7* 9.2* 9.3*  ALBUMIN 3.1* 3.3* 3.0*   No results for input(s): LIPASE, AMYLASE in the last 168 hours. No results for input(s): AMMONIA in the last 168 hours. CBC: Recent Labs  Lab 09/05/18 1539 09/05/18 1951 09/06/18 0250 09/07/18 0332  WBC 4.6 4.5 5.5 7.2  NEUTROABS 2.8  --  3.8 6.0  HGB 11.5* 11.3* 11.6* 10.9*  HCT 37.3 36.5 38.1 35.0*  MCV 96.6 96.3 97.4 95.4  PLT 253 235 224 293   Cardiac Enzymes: No results for input(s): CKTOTAL, CKMB, CKMBINDEX, TROPONINI in the last 168 hours. BNP: Invalid input(s): POCBNP CBG: No  results for input(s): GLUCAP in the last 168 hours. D-Dimer Recent Labs    09/06/18 0250 09/07/18 0332  DDIMER >20.00* 1.30*   Hgb A1c No results for input(s): HGBA1C in the last 72 hours. Lipid Profile Recent Labs    09/05/18 1539  TRIG 124   Thyroid function studies No results for input(s):  TSH, T4TOTAL, T3FREE, THYROIDAB in the last 72 hours.  Invalid input(s): FREET3 Anemia work up Recent Labs    09/06/18 0250 09/07/18 0332  FERRITIN 141 131   Urinalysis    Component Value Date/Time   COLORURINE YELLOW 11/14/2014 2119   APPEARANCEUR CLEAR 11/14/2014 2119   LABSPEC 1.022 11/14/2014 2119   PHURINE 5.0 11/14/2014 2119   [place] NEGATIVE 11/14/2014 2119   HGBUR NEGATIVE 11/14/2014 2119   [place] NEGATIVE 11/14/2014 2119   [place] NEGATIVE 11/14/2014 2119   PROTEINUR NEGATIVE 11/14/2014 2119   UROBILINOGEN 1.0 11/14/2014 2119   NITRITE NEGATIVE 11/14/2014 2119   LEUKOCYTESUR NEGATIVE 11/14/2014 2119    Microbiology Recent Results (from the past 240 hour(s))  Blood Culture (routine x 2)     Status: None (Preliminary result)   Collection Time: 09/05/18  3:40 PM   Specimen: BLOOD LEFT HAND  Result Value Ref Range Status   Specimen Description BLOOD LEFT HAND  Final   Special Requests   Final    BOTTLES DRAWN AEROBIC AND ANAEROBIC Blood Culture results may not be optimal due to an inadequate volume of blood received in culture bottles   Culture   Final    NO GROWTH 2 DAYS Performed at [facility], [place] [address], [place], [place] [address]    Report Status PENDING  Incomplete  Blood Culture (routine x 2)     Status: None (Preliminary result)   Collection Time: 09/05/18  3:46 PM   Specimen: BLOOD RIGHT HAND  Result Value Ref Range Status   Specimen Description BLOOD RIGHT HAND  Final   Special Requests   Final    BOTTLES DRAWN AEROBIC AND ANAEROBIC Blood Culture results may not be optimal due to an inadequate volume of blood received in culture  bottles   Culture   Final    NO GROWTH 2 DAYS Performed at [facility], [place] [address] [place], [place] [address]    Report Status PENDING  Incomplete    Time coordinating discharge: Approximately 40 minutes  Patrecia Pour, MD  [facility] 09/07/2018, 11:42 AM

## 2018-09-10 LAB — CULTURE, BLOOD (ROUTINE X 2)
Culture: NO GROWTH
Culture: NO GROWTH

## 2018-09-15 NOTE — Progress Notes (Signed)
Patient ID: Lisa Foley, female   DOB: 1970-12-28, 48 y.o.   MRN: [id number] Virtual Visit via Telephone Note  I connected with Lisa Foley on 09/16/18 at  9:50 AM EDT by telephone and verified that I am speaking with the correct person using two identifiers.   I discussed the limitations, risks, security and privacy concerns of performing an evaluation and management service by telephone and the availability of in person appointments. I also discussed with the patient that there may be a patient responsible charge related to this service. The patient expressed understanding and agreed to proceed.  Patient location:  home [facility] Persons on the call: me and the patient    History of Present Illness: After hospitalization 6/28-6/30/2020 for Covid-19. Feeling much better.  Still not back to normal energy.  No GI symptoms now.  Appetite is good.  Mom in hospital with Covid and sister has also had it.  No fevers.  No SOB  From discharge summary: Brief/Interim Summary: Lisa IvanoffUsherlene Foley [name], [name], fatigue, headache, myalgias, [name], d-dimer 2.14, [name], cigarettes, [name]  Acutebronchitis with respiratory distress due toCOVID-19pneumonia: - Continue isolation x2 weeks - Steroid taper  Elevated d-dimer: Rose from 2 to >20 in 24 hours, then back to ~1 (slightly elevation expected due to covid), so suspect this was a spurious result. No clinical evidence of DVT. Doubt PE with such hemodynamic stability and no hypoxia.  - No further work up indicated.  Acute gastroenteritis:due to covid-19 infection.  - Push per oral hydration. Symptomatically improved.  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 - Avoid NSAIDs as able.  Tobacco dependence: - Cessation counseling provided.   Obesity: BMI 33.  - Weight loss recommended long term.   Observations/Objective:  A&Ox3   Assessment and Plan: 1. Acute bronchitis due to COVID-19 virus Resolved/improving. Fluids, rest, respiratory care discussed.  May RTW July 15th if completely symptom free  2. Lupus (HCC) - Ambulatory referral to Rheumatology  3. Hospital discharge follow-up Doing well  4. Gastroenteritis due to COVID-19 virus resolved  5. Hyperglycemia I have had a lengthy discussion and provided education about insulin resistance and the intake of too much sugar/refined carbohydrates.  I have advised the patient to work at a goal of eliminating sugary drinks, candy, desserts, sweets, refined sugars, processed foods, and white carbohydrates.  The patient expresses understanding.    Follow Up Instructions: 6-8 weeks f/up labs and assign PCP   I discussed the assessment and treatment plan with the patient. The patient was provided an opportunity to  ask questions and all were answered. The patient agreed with the plan and demonstrated an understanding of the instructions.    The patient was advised to call back or seek an in-person evaluation if the symptoms worsen or if the condition fails to improve as anticipated.  I provided 12 minutes of non-face-to-face time during this encounter.   Freeman Caldron, PA-C

## 2018-09-16 ENCOUNTER — Ambulatory Visit: Payer: Medicare Other | Attending: Family Medicine | Admitting: Physician Assistant

## 2018-09-16 ENCOUNTER — Other Ambulatory Visit: Payer: Self-pay

## 2018-09-16 DIAGNOSIS — J208 Acute bronchitis due to other specified organisms: Secondary | ICD-10-CM

## 2018-09-16 DIAGNOSIS — Z09 Encounter for follow-up examination after completed treatment for conditions other than malignant neoplasm: Secondary | ICD-10-CM

## 2018-09-16 DIAGNOSIS — A0839 Other viral enteritis: Secondary | ICD-10-CM | POA: Diagnosis not present

## 2018-09-16 DIAGNOSIS — U071 COVID-19: Secondary | ICD-10-CM

## 2018-09-16 DIAGNOSIS — M329 Systemic lupus erythematosus, unspecified: Secondary | ICD-10-CM | POA: Diagnosis not present

## 2018-09-16 DIAGNOSIS — R739 Hyperglycemia, unspecified: Secondary | ICD-10-CM

## 2018-11-02 ENCOUNTER — Telehealth: Payer: Self-pay | Admitting: General Practice

## 2018-11-02 NOTE — Telephone Encounter (Signed)
Kieth Brightly with [employer] called to get a copy of patients insurance card. Please follow up.

## 2018-11-03 NOTE — Telephone Encounter (Signed)
Noted. Thank You.

## 2018-11-17 ENCOUNTER — Ambulatory Visit: Payer: Medicare Other | Admitting: Family Medicine

## 2018-12-03 ENCOUNTER — Ambulatory Visit: Payer: Self-pay | Admitting: Family Medicine

## 2019-07-01 ENCOUNTER — Emergency Department (HOSPITAL_COMMUNITY): Payer: Self-pay

## 2019-07-01 ENCOUNTER — Inpatient Hospital Stay (HOSPITAL_COMMUNITY)
Admission: EM | Admit: 2019-07-01 | Discharge: 2019-07-05 | DRG: 641 | Disposition: A | Discharge status: Home / Self Care | Payer: Self-pay | Attending: Family Medicine | Admitting: Family Medicine

## 2019-07-01 ENCOUNTER — Encounter (HOSPITAL_COMMUNITY): Payer: Self-pay | Admitting: Emergency Medicine

## 2019-07-01 ENCOUNTER — Other Ambulatory Visit: Payer: Self-pay

## 2019-07-01 DIAGNOSIS — Z79890 Hormone replacement therapy (postmenopausal): Secondary | ICD-10-CM

## 2019-07-01 DIAGNOSIS — K529 Noninfective gastroenteritis and colitis, unspecified: Secondary | ICD-10-CM | POA: Diagnosis present

## 2019-07-01 DIAGNOSIS — Z8616 Personal history of COVID-19: Secondary | ICD-10-CM

## 2019-07-01 DIAGNOSIS — M549 Dorsalgia, unspecified: Secondary | ICD-10-CM | POA: Diagnosis present

## 2019-07-01 DIAGNOSIS — E876 Hypokalemia: Principal | ICD-10-CM | POA: Diagnosis present

## 2019-07-01 DIAGNOSIS — F17210 Nicotine dependence, cigarettes, uncomplicated: Secondary | ICD-10-CM | POA: Diagnosis present

## 2019-07-01 DIAGNOSIS — G8929 Other chronic pain: Secondary | ICD-10-CM | POA: Diagnosis present

## 2019-07-01 DIAGNOSIS — Z96649 Presence of unspecified artificial hip joint: Secondary | ICD-10-CM | POA: Diagnosis present

## 2019-07-01 DIAGNOSIS — E871 Hypo-osmolality and hyponatremia: Secondary | ICD-10-CM | POA: Diagnosis present

## 2019-07-01 DIAGNOSIS — F4321 Adjustment disorder with depressed mood: Secondary | ICD-10-CM

## 2019-07-01 DIAGNOSIS — Z79899 Other long term (current) drug therapy: Secondary | ICD-10-CM

## 2019-07-01 DIAGNOSIS — R197 Diarrhea, unspecified: Secondary | ICD-10-CM

## 2019-07-01 DIAGNOSIS — M329 Systemic lupus erythematosus, unspecified: Secondary | ICD-10-CM | POA: Diagnosis present

## 2019-07-01 DIAGNOSIS — R05 Cough: Secondary | ICD-10-CM | POA: Diagnosis present

## 2019-07-01 DIAGNOSIS — Z832 Family history of diseases of the blood and blood-forming organs and certain disorders involving the immune mechanism: Secondary | ICD-10-CM

## 2019-07-01 DIAGNOSIS — F321 Major depressive disorder, single episode, moderate: Secondary | ICD-10-CM | POA: Diagnosis present

## 2019-07-01 DIAGNOSIS — R112 Nausea with vomiting, unspecified: Secondary | ICD-10-CM

## 2019-07-01 DIAGNOSIS — R1110 Vomiting, unspecified: Secondary | ICD-10-CM

## 2019-07-01 LAB — CBC
HCT: 40.8 % (ref 36.0–46.0)
Hemoglobin: 12.7 g/dL (ref 12.0–15.0)
MCH: 30.2 pg (ref 26.0–34.0)
MCHC: 31.1 g/dL (ref 30.0–36.0)
MCV: 97.1 fL (ref 80.0–100.0)
Platelets: 291 10*3/uL (ref 150–400)
RBC: 4.2 MIL/uL (ref 3.87–5.11)
RDW: 14.3 % (ref 11.5–15.5)
WBC: 5.8 10*3/uL (ref 4.0–10.5)
nRBC: 0 % (ref 0.0–0.2)

## 2019-07-01 LAB — LIPASE, BLOOD: Lipase: 19 U/L (ref 11–51)

## 2019-07-01 LAB — COMPREHENSIVE METABOLIC PANEL
ALT: 29 U/L (ref 0–44)
AST: 38 U/L (ref 15–41)
Albumin: 3.2 g/dL — ABNORMAL LOW (ref 3.5–5.0)
Alkaline Phosphatase: 45 U/L (ref 38–126)
Anion gap: 9 (ref 5–15)
BUN: <5 mg/dL — ABNORMAL LOW (ref 6–20)
CO2: 26 mmol/L (ref 22–32)
Calcium: 8.8 mg/dL — ABNORMAL LOW (ref 8.9–10.3)
Chloride: 98 mmol/L (ref 98–111)
Creatinine, Ser: 0.39 mg/dL — ABNORMAL LOW (ref 0.44–1.00)
GFR calc Af Amer: >60 mL/min (ref >60)
GFR calc non Af Amer: >60 mL/min (ref >60)
Glucose, Bld: 84 mg/dL (ref 70–99)
Potassium: 2.4 mmol/L — CL (ref 3.5–5.1)
Sodium: 133 mmol/L — ABNORMAL LOW (ref 135–145)
Total Bilirubin: 1 mg/dL (ref 0.3–1.2)
Total Protein: 8.7 g/dL — ABNORMAL HIGH (ref 6.5–8.1)

## 2019-07-01 LAB — URINALYSIS, ROUTINE W REFLEX MICROSCOPIC
Bacteria, UA: NONE SEEN
Bilirubin Urine: NEGATIVE
Glucose, UA: NEGATIVE mg/dL
Hgb urine dipstick: NEGATIVE
Ketones, ur: 5 mg/dL — AB
Nitrite: NEGATIVE
Protein, ur: 100 mg/dL — AB
Specific Gravity, Urine: 1.027 (ref 1.005–1.030)
pH: 5 (ref 5.0–8.0)

## 2019-07-01 LAB — I-STAT BETA HCG BLOOD, ED (MC, WL, AP ONLY): I-stat hCG, quantitative: <5 m[IU]/mL (ref <5)

## 2019-07-01 MED ORDER — DIPHENHYDRAMINE HCL 25 MG PO CAPS
50.0000 mg | ORAL_CAPSULE | Freq: Once | ORAL | Status: AC
  Administered 2019-07-01: 50 mg via ORAL
  Filled 2019-07-01: qty 2

## 2019-07-01 MED ORDER — SODIUM CHLORIDE 0.9% FLUSH: 3.0000 mL | Freq: Once | INTRAVENOUS | Status: DC

## 2019-07-01 MED ORDER — POTASSIUM CHLORIDE 10 MEQ/100ML IV SOLN
10.0000 meq | INTRAVENOUS | Status: DC
  Administered 2019-07-02 (×4): 10 meq via INTRAVENOUS
  Filled 2019-07-01 (×3): qty 100

## 2019-07-01 MED ORDER — ONDANSETRON HCL 4 MG/2ML IJ SOLN
4.0000 mg | Freq: Once | INTRAMUSCULAR | Status: DC
  Filled 2019-07-01: qty 2

## 2019-07-01 MED ORDER — DIPHENHYDRAMINE HCL 25 MG PO CAPS
25.0000 mg | ORAL_CAPSULE | Freq: Once | ORAL | Status: DC
  Filled 2019-07-01: qty 1

## 2019-07-01 MED ORDER — MORPHINE SULFATE (PF) 4 MG/ML IV SOLN
4.0000 mg | Freq: Once | INTRAVENOUS | Status: AC
  Administered 2019-07-02: 4 mg via INTRAVENOUS
  Filled 2019-07-01: qty 1

## 2019-07-01 MED ORDER — MAGNESIUM SULFATE 2 GM/50ML IV SOLN
2.0000 g | Freq: Once | INTRAVENOUS | Status: DC
  Filled 2019-07-01: qty 50

## 2019-07-01 MED ORDER — SODIUM CHLORIDE 0.9 % IV BOLUS: 1000.0000 mL | Freq: Once | INTRAVENOUS | Status: DC

## 2019-07-01 MED ORDER — POTASSIUM CHLORIDE CRYS ER 20 MEQ PO TBCR
40.0000 meq | EXTENDED_RELEASE_TABLET | Freq: Once | ORAL | Status: AC
  Administered 2019-07-01: 40 meq via ORAL
  Filled 2019-07-01: qty 2

## 2019-07-01 NOTE — ED Provider Notes (Signed)
[facility] EMERGENCY DEPARTMENT Provider Note   CSN: [id number] Arrival date & time: 07/01/19  1504     History Chief Complaint  Patient presents with  . Emesis    Lisa Foley is a 49 y.o. female.  HPI Patient is 49 year old female with history of carpal tunnel syndrome lupus presented today with multiple complaints.  She states that she has had back pain for approximately 2 months which he states is achy, worse with movement and touch.  She also states that she has had nausea vomiting and diarrhea for the past 2 weeks although she states that she has had nausea for "a long time ". She denies any bloody or bilious vomiting.  She denies any abdominal pain.   Patient is a vague historian but it appears that her symptoms have been mostly chronic although her vomiting has been more so over the past several days and her diarrhea although intermittent has been more constant the last several days as well.  She denies any headache, lightheadedness or dizziness.     Past Medical History:  Diagnosis Date  . Carpal tunnel syndrome   . Lupus [facility])     Patient Active Problem List   Diagnosis Date Noted  . Hypokalemia 07/02/2019  . Acute bronchitis due to COVID-19 virus 09/05/2018  . Gastroenteritis due to COVID-19 virus 09/05/2018  . Lupus (HCC) 06/08/2015  . Nicotine dependence, cigarettes, uncomplicated 06/08/2015  . Carpal tunnel syndrome   . Vitamin D deficiency 01/01/2010  . Osteoarthritis of right hip 06/28/2009    Past Surgical History:  Procedure Laterality Date  . TOTAL HIP ARTHROPLASTY    . TUBAL LIGATION       OB History   No obstetric history on file.     Family History  Problem Relation Age of Onset  . Cancer Father   . High blood pressure Mother   . High blood pressure Brother   . High blood pressure Brother   . Lupus Sister   . Lupus Sister     Social History   Tobacco Use  . Smoking status: Current Every Day Smoker   Last attempt to quit: 04/28/2015    Years since quitting: 4.1  . Smokeless tobacco: Never Used  Substance Use Topics  . Alcohol use: Yes    Alcohol/week: 0.0 standard drinks    Comment: occasional  . Drug use: No    Home Medications Prior to Admission medications   Medication Sig Start Date End Date Taking? Authorizing Provider  acetaminophen (TYLENOL) 500 MG tablet Take 2 tablets (1,000 mg total) by mouth every 8 (eight) hours as needed for mild pain. Do NOT exceed 4,000mg  in 24 hours Patient taking differently: Take 2,000 mg by mouth as needed for moderate pain. Do NOT exceed 4,000mg  in 24 hours 09/07/18  Yes Tyrone Nine, MD  budesonide-formoterol [facility]) 160-4.5 MCG/ACT inhaler Inhale 2 puffs into the lungs 2 (two) times daily. 09/07/18 09/07/19 Yes Tyrone Nine, MD    Allergies    Advair hfa [fluticasone-salmeterol] and Aleve [naproxen sodium]  Review of Systems   Review of Systems  Constitutional: Positive for chills and fatigue. Negative for fever.  HENT: Negative for congestion.   Respiratory: Positive for cough and shortness of breath.   Cardiovascular: Negative for chest pain.  Gastrointestinal: Positive for diarrhea, nausea and vomiting. Negative for abdominal distention and abdominal pain.  Musculoskeletal: Positive for back pain.  Skin:       Itching generalized  Neurological: Negative  for dizziness and headaches.  Psychiatric/Behavioral: Positive for dysphoric mood.    Physical Exam Updated Vital Signs BP 108/61   Pulse 90   Temp 97.6 F (36.4 C) (Oral)   Resp 20   SpO2 100%   Physical Exam Vitals and nursing note reviewed.  Constitutional:      General: She is not in acute distress.    Comments: Patient is pleasant 49 year old female.  Flat affect and poor eye contact.  She is poor historian but able to answer questions appropriately and follow commands.  HENT:     Head: Normocephalic and atraumatic.     Nose: Nose normal.     Mouth/Throat:      Mouth: Mucous membranes are dry.  Eyes:     General: No scleral icterus. Cardiovascular:     Rate and Rhythm: Normal rate and regular rhythm.     Pulses: Normal pulses.     Heart sounds: Normal heart sounds.  Pulmonary:     Effort: Pulmonary effort is normal. No respiratory distress.     Breath sounds: No wheezing.     Comments: Lungs are grossly clear to auscultation bilaterally.  There are some coarse breath sounds throughout. Abdominal:     Palpations: Abdomen is soft.     Tenderness: There is no abdominal tenderness.  Musculoskeletal:     Cervical back: Normal range of motion.     Right lower leg: No edema.     Left lower leg: No edema.  Skin:    General: Skin is warm and dry.     Capillary Refill: Capillary refill takes less than 2 seconds.  Neurological:     Mental Status: She is alert. Mental status is at baseline.  Psychiatric:        Behavior: Behavior normal.     Comments: Behavior but dysphoric mood     ED Results / Procedures / Treatments   Labs (all labs ordered are listed, but only abnormal results are displayed) Labs Reviewed  COMPREHENSIVE METABOLIC PANEL - Abnormal; Notable for the following components:      Result Value   Sodium 133 (*)    Potassium 2.4 (*)    BUN <5 (*)    Creatinine, Ser 0.39 (*)    Calcium 8.8 (*)    Total Protein 8.7 (*)    Albumin 3.2 (*)    All other components within normal limits  URINALYSIS, ROUTINE W REFLEX MICROSCOPIC - Abnormal; Notable for the following components:   Color, Urine AMBER (*)    APPearance HAZY (*)    Ketones, ur 5 (*)    Protein, ur 100 (*)    Leukocytes,Ua TRACE (*)    All other components within normal limits  MAGNESIUM - Abnormal; Notable for the following components:   Magnesium 1.6 (*)    All other components within normal limits  SARS CORONAVIRUS 2 (TAT 6-24 HRS)  LIPASE, BLOOD  CBC  I-STAT BETA HCG BLOOD, ED ([facility], [facility], [facility] ONLY)    EKG EKG Interpretation  Date/Time:  Friday July 01 2019  15:16:48 EDT Ventricular Rate:  80 PR Interval:  190 QRS Duration: 88 QT Interval:  392 QTC Calculation: 452 R Axis:   40 Text Interpretation: Normal sinus rhythm ST & T wave abnormality, consider inferior ischemia Abnormal ECG Confirmed by Kennis Carina [phone]) on 07/01/2019 9:51:35 PM   Radiology DG Chest 2 View  Result Date: 07/01/2019 CLINICAL DATA:  Shortness of breath.  History of lupus. EXAM: CHEST - 2 VIEW  COMPARISON:  September 06, 2018 FINDINGS: Bilateral interstitial opacities are identified. Increased patchy opacity in the right base and left mid lung/base, increased since 2020. No pneumothorax. The cardiomediastinal silhouette is stable. No other acute abnormalities. IMPRESSION: 1. Bilateral pulmonary opacities. There is certainly a chronic component. The increased in the interval could represent worsening interstitial lung disease. However, superimposed developing infiltrates/pneumonia are not excluded. Electronically Signed   By: Gerome Sam III M.D   On: 07/01/2019 16:24    Procedures .Critical Care Performed by: Gailen Shelter, PA Authorized by: Gailen Shelter, PA   Critical care provider statement:    Critical care time (minutes):  35   Critical care time was exclusive of:  Separately billable procedures and treating other patients and teaching time   Critical care was necessary to treat or prevent imminent or life-threatening deterioration of the following conditions:  Metabolic crisis (hypokalemia)   Critical care was time spent personally by me on the following activities:  Discussions with consultants, evaluation of patient's response to treatment, examination of patient, review of old charts, re-evaluation of patient's condition, pulse oximetry, ordering and review of radiographic studies, ordering and review of laboratory studies and ordering and performing treatments and interventions   I assumed direction of critical care for this patient from another provider in  my specialty: no    Ultrasound ED Peripheral IV (Provider)  Date/Time: 07/02/2019 12:41 AM Performed by: Gailen Shelter, PA Authorized by: Gailen Shelter, PA   Procedure details:    Indications: hydration, multiple failed IV attempts and poor IV access     Skin Prep: chlorhexidine gluconate     Location:  Right AC   Angiocath:  20 G   Bedside Ultrasound Guided: Yes     Images: not archived     Patient tolerated procedure without complications: Yes     Dressing applied: Yes     (including critical care time)   Medications Ordered in ED Medications  sodium chloride flush (NS) 0.9 % injection 3 mL (3 mLs Intravenous Not Given 07/01/19 2055)  ondansetron (ZOFRAN) injection 4 mg (has no administration in time range)  morphine 4 MG/ML injection 4 mg (has no administration in time range)  sodium chloride 0.9 % bolus 1,000 mL (has no administration in time range)  potassium chloride 10 mEq in 100 mL IVPB (has no administration in time range)  magnesium sulfate IVPB 2 g 50 mL (has no administration in time range)  diphenhydrAMINE (BENADRYL) capsule 50 mg (50 mg Oral Given 07/01/19 2305)  potassium chloride SA (KLOR-CON) CR tablet 40 mEq (40 mEq Oral Given 07/01/19 2305)    ED Course  I have reviewed the triage vital signs and the nursing notes.  Pertinent labs & imaging results that were available during my care of the patient were reviewed by me and considered in my medical decision making (see chart for details).    MDM Rules/Calculators/A&P                      Patient is 49 year old female presented today with nausea, vomiting, diarrhea, back pain, itchy skin, transient shortness of breath and chest pain that have now completely resolved.  She is found to be severely hypokalemic at 2.4 with magnesium of 1.6.  She was given oral and IV potassium and IV magnesium.  No leukocytosis or anemia.  CMP otherwise unremarkable she has notably low creatinine and BUN.  Vital signs are  within normal is apart from  mild tachypnea.  This appears to be somewhat voluntary and she is easily upset.  Doubt acute cardiopulmonary disease.  Chest x-ray independently read by myself shows mostly chronic changes.  Questionable pneumonia however I doubt true bacterial pneumonia or viral pneumonia given her symptoms today.  It appears that all of her lung issues, cough/shortness of breath are chronic and unchanged today.  Her Covid test is negative.  Given her severe hypokalemia will admit patient to hospital service for repletion and reassessment in the morning.  I discussed this case with my attending physician who cosigned this note including patient's presenting symptoms, physical exam, and planned diagnostics and interventions. Attending physician stated agreement with plan or made changes to plan which were implemented.   Patient is satting 100% on room air, is not tachycardic and denies any chest pain.  She is PERC negative.  Has never had a blood clot in her life.  Although she has a risk factor in the form of SLE low suspicion for this at that time.  Most likely viral illness, gastritis, gastroenteritis or other benign process causing her symptoms of vomiting and diarrhea leading to hypokalemia.  Final Clinical Impression(s) / ED Diagnoses Final diagnoses:  Vomiting and diarrhea  Hypokalemia     Rx / DC Orders ED Discharge Orders    None       Tedd Sias, [place] 07/02/19 0962    Maudie Flakes, MD 07/04/19 [phone]

## 2019-07-01 NOTE — ED Triage Notes (Addendum)
Pt BIB [facility] with multiple complaints. Pt c/o increased shortness of breath, nausea/vomiting/diarhea and runny nose x 1 week. Pt also complains that her skin hurts and is itchy, hx Lupus. SpO2 91% on room air with EMS, improved to 95% on 2LNC. EMS VS: BBP 154/92, HR 90, RR 20, CBG 119

## 2019-07-01 NOTE — H&P (Signed)
[facility] History and Physical  Brindle Leyba BJS:[id number] DOB: 11-Feb-1971 DOA: 07/01/2019  Referring EDP: Ova Freshwater PCP: Patient, No Pcp Per   Chief Complaint: Nausea, Vomiting, Diarrhea   HPI: Gessica Jawad is a 49 y.o. female with PMH of SLE presented to ED with nausea, vomiting and diarrhea and admitted for hypokalemia.   Patient reports two weeks of intermittent diarrhea, nausea and vomiting. She sometimes has diarrhea 5+ times per day. No blood present and denies fever. She is able to keep down some fluids but has had trouble keeping down solids. Nausea and vomiting has been present for a "long time." Denies abdominal pain. Unable to note pattern to nausea/vomiting or diarrhea. Denies sick contacts. She has not taken anything for vomiting or diarrhea. Also reports back pain which she has intermittently due to lupus. Reports chronic cough that is unchanged. She has itching all over her body which she has very frequently. Patient states that her mother died in 09-27-2018 and since that time she has been very sad and just "wants to be with her." Denies SI/HI. Denies headache, dizziness, chills, cough, SOB, chest pain, constipation, dysuria, hematuria, hematochezia, melena, difficulty moving arms/legs, speech difficulty, confusion or any other complaints.  In the ED: Vitals stable. Labs remarkable for: UA with trace leuks, Na 133, K 2.4, Cr 0.39, Mag 1.6. CBC and Lipase WNL.  CXR: Bilateral pulmonary opacities. There is certainly a chronic component. The increased in the interval could represent worsening interstitial lung disease. However, superimposed developing infiltrates/pneumonia are not excluded.  Patient was given Benadryl, Morphine, potassium replacement and called for admission for hypokalemia and inability to tolerate PO.  Review of Systems:  All other systems negative unless noted above in HPI.   Past Medical History:  Diagnosis Date  . Acute  bronchitis due to COVID-19 virus 09/05/2018  . Carpal tunnel syndrome   . Gastroenteritis due to COVID-19 virus 09/05/2018  . Lupus [facility])    Past Surgical History:  Procedure Laterality Date  . TOTAL HIP ARTHROPLASTY    . TUBAL LIGATION     Social History:  reports that she has been smoking. She has never used smokeless tobacco. She reports current alcohol use. She reports that she does not use drugs.  Allergies  Allergen Reactions  . Advair Hfa [Fluticasone-Salmeterol] Swelling  . Aleve [Naproxen Sodium] Swelling    Family History  Problem Relation Age of Onset  . Cancer Father   . High blood pressure Mother   . High blood pressure Brother   . High blood pressure Brother   . Lupus Sister   . Lupus Sister     Prior to Admission medications   Medication Sig Start Date End Date Taking? Authorizing Provider  acetaminophen (TYLENOL) 500 MG tablet Take 2 tablets (1,000 mg total) by mouth every 8 (eight) hours as needed for mild pain. Do NOT exceed 4,000mg  in 24 hours Patient taking differently: Take 2,000 mg by mouth as needed for moderate pain. Do NOT exceed 4,000mg  in 24 hours 09/07/18  Yes Patrecia Pour, MD  budesonide-formoterol [facility]) 160-4.5 MCG/ACT inhaler Inhale 2 puffs into the lungs 2 (two) times daily. 09/07/18 09/07/19 Yes Patrecia Pour, MD   Physical Exam: Vitals:   07/01/19 1505 07/01/19 1831 07/01/19 2046  BP: 127/79 116/70 108/61  Pulse: 92 68 90  Resp: 20 (!) 22 20  Temp: (!) 97.4 F (36.3 C) 97.6 F (36.4 C)   TempSrc: Oral Oral   SpO2: 91% 100% 100%  Wt Readings from Last 3 Encounters:  09/05/18 96.7 kg  03/23/17 117.9 kg  07/12/15 108.4 kg    . General:  Appears calm and comfortable. AAOx4.  . Eyes: EOMI, normal lids, irises & conjunctiva . ENT: grossly normal hearing, lips & tongue . Neck: normal ROM . Cardiovascular: RRR, no m/r/g. No LE edema. Marland Kitchen Respiratory: CTA bilaterally, no w/r/r. Normal respiratory effort. . Abdomen: soft,  ntnd . Skin: no rash or induration seen on limited exam . Musculoskeletal: grossly normal tone BUE/BLE . Psychiatric: poor eye contact, tearful,  speech fluent and appropriate . Neurologic: grossly non-focal.          Labs on Admission:  Basic Metabolic Panel: Recent Labs  Lab 07/01/19 2017  NA 133*  K 2.4*  CL 98  CO2 26  GLUCOSE 84  BUN <5*  CREATININE 0.39*  CALCIUM 8.8*  MG 1.6*   Liver Function Tests: Recent Labs  Lab 07/01/19 2017  AST 38  ALT 29  ALKPHOS 45  BILITOT 1.0  PROT 8.7*  ALBUMIN 3.2*   Recent Labs  Lab 07/01/19 2017  LIPASE 19   No results for input(s): AMMONIA in the last 168 hours. CBC: Recent Labs  Lab 07/01/19 2017  WBC 5.8  HGB 12.7  HCT 40.8  MCV 97.1  PLT 291   Cardiac Enzymes: No results for input(s): CKTOTAL, CKMB, CKMBINDEX, TROPONINI in the last 168 hours.  BNP (last 3 results) No results for input(s): BNP in the last 8760 hours.  ProBNP (last 3 results) No results for input(s): PROBNP in the last 8760 hours.  CBG: No results for input(s): GLUCAP in the last 168 hours.  Radiological Exams on Admission: DG Chest 2 View  Result Date: 07/01/2019 CLINICAL DATA:  Shortness of breath.  History of lupus. EXAM: CHEST - 2 VIEW COMPARISON:  September 06, 2018 FINDINGS: Bilateral interstitial opacities are identified. Increased patchy opacity in the right base and left mid lung/base, increased since 2020. No pneumothorax. The cardiomediastinal silhouette is stable. No other acute abnormalities. IMPRESSION: 1. Bilateral pulmonary opacities. There is certainly a chronic component. The increased in the interval could represent worsening interstitial lung disease. However, superimposed developing infiltrates/pneumonia are not excluded. Electronically Signed   By: Gerome Sam III M.D   On: 07/01/2019 16:24    EKG: Independently reviewed. HR 80. NSR. QTc 452. No STEMI.   Assessment/Plan Principal Problem:   Hypokalemia Active  Problems:   Lupus (HCC)   Nausea and vomiting   Diarrhea   Grief   MDD (major depressive disorder), single episode, moderate (HCC)   Hypomagnesemia  49 y.o. female with PMH of SLE presented to ED with nausea, vomiting and diarrhea and admitted for hypokalemia.   Hypokalemia Hypomagnesemia  Nausea/Vomiting/Diarrhea - likely due to acute gastroenteritis; no fever or bloody emesis/stools to suggest otherwise; vitals stable - cont Mag and K replacement and recheck  - IVF ordered - Phos level ordered  - Zofran - regular diet as tolerated - Multivitamin    Lupus  Chronic Pain Chronic Itching - not currently on Plaquenil as she is transitioning between providers; encouraged f/u - Dilaudid, Flexeril, Tylenol, Benadryl PRN, heating pad - Despite CXR read, no evidence of respiratory distress or acute infection, will hold on abx - does not seem to be SLE flare, could consider steroids if so - cont PTA inhaler   Grief MDD, moderate, first episode - reports symptoms started in 07-26-2020after her mother died and she has not been able  to "bounce back" or continue to enjoy the activities she used to enjoy - reports wanting to "be with her mother" but denies SI/HI - She is interested in starting a medication for depression; due to GI symptoms normally caused with initiation of SSRI, will hold for now, but recommend initiation once GI symptoms improved so as to not exacerbate current symptoms  - Spiritual Consult placed  Code Status: Full DVT Prophylaxis: Lovenox Family Communication: None Disposition Plan: Admit to Obs. Patient with acute electrolyte derangement requiring IV replacement and monitoring. Also requiring IVF. Anticipate discharge within 24 hours.  Time spent: 50 minutes  Joselyn Arrow, MD [facility] Pager [phone]

## 2019-07-01 NOTE — ED Notes (Signed)
Pt ambulated to bathroom unassisted.

## 2019-07-01 NOTE — ED Notes (Signed)
Pt c/o worsening itching and SOB. Reassessed: Pt eyelids are reddened, no hives noted (checked trunk, arms, neck, face). O2 sats @ 100% on 2 liters ( per pt, placed on )2 by EMS).

## 2019-07-02 ENCOUNTER — Encounter (HOSPITAL_COMMUNITY): Payer: Self-pay | Admitting: Family Medicine

## 2019-07-02 DIAGNOSIS — R112 Nausea with vomiting, unspecified: Secondary | ICD-10-CM

## 2019-07-02 DIAGNOSIS — F321 Major depressive disorder, single episode, moderate: Secondary | ICD-10-CM

## 2019-07-02 DIAGNOSIS — E876 Hypokalemia: Secondary | ICD-10-CM | POA: Diagnosis present

## 2019-07-02 DIAGNOSIS — F4321 Adjustment disorder with depressed mood: Secondary | ICD-10-CM

## 2019-07-02 DIAGNOSIS — R197 Diarrhea, unspecified: Secondary | ICD-10-CM

## 2019-07-02 LAB — CBC
HCT: 35.4 % — ABNORMAL LOW (ref 36.0–46.0)
Hemoglobin: 11.3 g/dL — ABNORMAL LOW (ref 12.0–15.0)
MCH: 30.7 pg (ref 26.0–34.0)
MCHC: 31.9 g/dL (ref 30.0–36.0)
MCV: 96.2 fL (ref 80.0–100.0)
Platelets: 272 10*3/uL (ref 150–400)
RBC: 3.68 MIL/uL — ABNORMAL LOW (ref 3.87–5.11)
RDW: 14.4 % (ref 11.5–15.5)
WBC: 5.6 10*3/uL (ref 4.0–10.5)
nRBC: 0 % (ref 0.0–0.2)

## 2019-07-02 LAB — COMPREHENSIVE METABOLIC PANEL
ALT: 26 U/L (ref 0–44)
AST: 35 U/L (ref 15–41)
Albumin: 2.7 g/dL — ABNORMAL LOW (ref 3.5–5.0)
Alkaline Phosphatase: 37 U/L — ABNORMAL LOW (ref 38–126)
Anion gap: 9 (ref 5–15)
BUN: <5 mg/dL — ABNORMAL LOW (ref 6–20)
CO2: 24 mmol/L (ref 22–32)
Calcium: 8.4 mg/dL — ABNORMAL LOW (ref 8.9–10.3)
Chloride: 105 mmol/L (ref 98–111)
Creatinine, Ser: 0.41 mg/dL — ABNORMAL LOW (ref 0.44–1.00)
GFR calc Af Amer: >60 mL/min (ref >60)
GFR calc non Af Amer: >60 mL/min (ref >60)
Glucose, Bld: 99 mg/dL (ref 70–99)
Potassium: 3.3 mmol/L — ABNORMAL LOW (ref 3.5–5.1)
Sodium: 138 mmol/L (ref 135–145)
Total Bilirubin: 1.1 mg/dL (ref 0.3–1.2)
Total Protein: 7.5 g/dL (ref 6.5–8.1)

## 2019-07-02 LAB — MAGNESIUM
Magnesium: 1.6 mg/dL — ABNORMAL LOW (ref 1.7–2.4)
Magnesium: 1.5 mg/dL — ABNORMAL LOW (ref 1.7–2.4)

## 2019-07-02 LAB — STREP PNEUMONIAE URINARY ANTIGEN: Strep Pneumo Urinary Antigen: NEGATIVE

## 2019-07-02 LAB — PROCALCITONIN: Procalcitonin: <0.1 ng/mL

## 2019-07-02 LAB — HIV ANTIBODY (ROUTINE TESTING W REFLEX): HIV Screen 4th Generation wRfx: NONREACTIVE

## 2019-07-02 LAB — PHOSPHORUS: Phosphorus: 2.6 mg/dL (ref 2.5–4.6)

## 2019-07-02 LAB — SARS CORONAVIRUS 2 (TAT 6-24 HRS): SARS Coronavirus 2: NEGATIVE

## 2019-07-02 MED ORDER — SODIUM CHLORIDE 0.9 % IV SOLN
2.0000 g | INTRAVENOUS | Status: DC
  Administered 2019-07-02 – 2019-07-03 (×2): 2 g via INTRAVENOUS
  Filled 2019-07-02 (×2): qty 20

## 2019-07-02 MED ORDER — ADULT MULTIVITAMIN W/MINERALS CH
1.0000 | ORAL_TABLET | Freq: Every day | ORAL | Status: DC
  Administered 2019-07-02 – 2019-07-05 (×4): 1 via ORAL
  Filled 2019-07-02 (×4): qty 1

## 2019-07-02 MED ORDER — ENOXAPARIN SODIUM 40 MG/0.4ML ~~LOC~~ SOLN
40.0000 mg | Freq: Every day | SUBCUTANEOUS | Status: DC
  Administered 2019-07-02 – 2019-07-05 (×4): 40 mg via SUBCUTANEOUS
  Filled 2019-07-02 (×4): qty 0.4

## 2019-07-02 MED ORDER — HYDROMORPHONE HCL 1 MG/ML IJ SOLN
1.0000 mg | INTRAMUSCULAR | Status: DC | PRN
  Administered 2019-07-03: 1 mg via INTRAVENOUS
  Filled 2019-07-02: qty 1

## 2019-07-02 MED ORDER — ONDANSETRON HCL 4 MG/2ML IJ SOLN
4.0000 mg | Freq: Once | INTRAMUSCULAR | Status: AC
  Administered 2019-07-02: 4 mg via INTRAVENOUS
  Filled 2019-07-02: qty 2

## 2019-07-02 MED ORDER — DIPHENHYDRAMINE HCL 25 MG PO CAPS
50.0000 mg | ORAL_CAPSULE | Freq: Four times a day (QID) | ORAL | Status: DC | PRN
  Administered 2019-07-02 – 2019-07-03 (×2): 50 mg via ORAL
  Filled 2019-07-02 (×2): qty 2

## 2019-07-02 MED ORDER — SODIUM CHLORIDE 0.9 % IV SOLN
500.0000 mg | INTRAVENOUS | Status: DC
  Administered 2019-07-02 – 2019-07-03 (×2): 500 mg via INTRAVENOUS
  Filled 2019-07-02 (×2): qty 500

## 2019-07-02 MED ORDER — CYCLOBENZAPRINE HCL 10 MG PO TABS
10.0000 mg | ORAL_TABLET | Freq: Three times a day (TID) | ORAL | Status: DC | PRN
  Administered 2019-07-05: 10 mg via ORAL
  Filled 2019-07-02: qty 1

## 2019-07-02 MED ORDER — FLUTICASONE FUROATE-VILANTEROL 200-25 MCG/INH IN AEPB
1.0000 | INHALATION_SPRAY | Freq: Every day | RESPIRATORY_TRACT | Status: DC
  Administered 2019-07-02 – 2019-07-05 (×4): 1 via RESPIRATORY_TRACT
  Filled 2019-07-02: qty 28

## 2019-07-02 MED ORDER — SODIUM CHLORIDE 0.9 % IV SOLN: INTRAVENOUS | Status: DC

## 2019-07-02 MED ORDER — MAGNESIUM SULFATE 2 GM/50ML IV SOLN
2.0000 g | Freq: Once | INTRAVENOUS | Status: AC
  Administered 2019-07-02: 2 g via INTRAVENOUS
  Filled 2019-07-02: qty 50

## 2019-07-02 MED ORDER — ACETAMINOPHEN 325 MG PO TABS
650.0000 mg | ORAL_TABLET | Freq: Four times a day (QID) | ORAL | Status: DC | PRN
  Administered 2019-07-02 – 2019-07-05 (×2): 650 mg via ORAL
  Filled 2019-07-02 (×2): qty 2

## 2019-07-02 MED ORDER — POTASSIUM CHLORIDE CRYS ER 20 MEQ PO TBCR
40.0000 meq | EXTENDED_RELEASE_TABLET | Freq: Once | ORAL | Status: AC
  Administered 2019-07-02: 40 meq via ORAL
  Filled 2019-07-02: qty 2

## 2019-07-02 NOTE — Progress Notes (Signed)
Progress Note    Lisa Foley  [id number]:[id number] DOB: 03-Oct-1970  DOA: 07/01/2019 PCP: Patient, No Pcp Per    Brief Narrative:    Medical records reviewed and are as summarized below:  Lisa Foley is an 49 y.o. female BIB [facility] with multiple complaints. Pt c/o increased shortness of breath, nausea/vomiting/diarhea and runny nose x 1 week.  Assessment/Plan:   Principal Problem:   Hypokalemia Active Problems:   Lupus (HCC)   Nausea and vomiting   Diarrhea   Grief   MDD (major depressive disorder), single episode, moderate (HCC)   Hypomagnesemia   Possible PNA -r/o strep pna -IV abx -check procalcitonin  Hypokalemia Hypomagnesemia  Nausea/Vomiting/Diarrhea - cont Mag and K replacement and recheck  - IVF ordered - Zofran -Full liquid diet for now  Lupus  Chronic Pain Chronic Itching - not currently on Plaquenil as she is transitioning between providers; encouraged f/u - Dilaudid, Flexeril, Tylenol, Benadryl PRN, heating pad - cont PTA inhaler   Grief MDD, moderate, first episode - reports symptoms started in Jul 22, 2020after her mother died and she has not been able to "bounce back" or continue to enjoy the activities she used to enjoy - reports wanting to "be with her mother" but denies SI/HI - She is interested in starting a medication for depression; due to GI symptoms normally caused with initiation of SSRI, will hold for now, but recommend initiation once GI symptoms improved so as to not exacerbate current symptoms  - Spiritual Consult placed   Family Communication/Anticipated D/C date and plan/Code Status   DVT prophylaxis: Lovenox ordered. Code Status: Full Code.  Disposition Plan: Status is: Observation  The patient will require care spanning > 2 midnights and should be moved to inpatient because: IV treatments appropriate due to intensity of illness or inability to take PO  Dispo: The patient is from: Home  Anticipated d/c is to: Home              Anticipated d/c date is: 2 days              Patient currently is not medically stable to d/c.         Medical Consultants:    None.     Subjective:   Reporting blood in her stool but did not show to nursing  Objective:    Vitals:   07/02/19 0220 07/02/19 0441 07/02/19 0841 07/02/19 0855  BP: (!) 104/53 (!) 104/49 122/64   Pulse: 71 87 92   Resp: 18 18 18 18   Temp: 98 F (36.7 C) 98.1 F (36.7 C) 97.9 F (36.6 C)   TempSrc: Oral  Oral   SpO2: 92% 93% 100%     Intake/Output Summary (Last 24 hours) at 07/02/2019 1223 Last data filed at 07/02/2019 1042 Gross per 24 hour  Intake 50 ml  Output -  Net 50 ml   There were no vitals filed for this visit.  Exam: In bed, poor eye contact Poor historian Regular rate and rhythm Poor effort but no wheezing auscultated Diminished bowel sounds (patient states she had a bowel movement today)  Data Reviewed:   I have personally reviewed following labs and imaging studies:  Labs: Labs show the following:   Basic Metabolic Panel: Recent Labs  Lab 07/01/19 2017 07/02/19 0040 07/02/19 0436  NA 133*  --  138  K 2.4*  --  3.3*  CL 98  --  105  CO2 26  --  24  GLUCOSE  84  --  99  BUN <5*  --  <5*  CREATININE 0.39*  --  0.41*  CALCIUM 8.8*  --  8.4*  MG 1.6*  --  1.5*  PHOS  --  2.6  --    GFR CrCl cannot be calculated (Unknown ideal weight.). Liver Function Tests: Recent Labs  Lab 07/01/19 2017 07/02/19 0436  AST 38 35  ALT 29 26  ALKPHOS 45 37*  BILITOT 1.0 1.1  PROT 8.7* 7.5  ALBUMIN 3.2* 2.7*   Recent Labs  Lab 07/01/19 2017  LIPASE 19   No results for input(s): AMMONIA in the last 168 hours. Coagulation profile No results for input(s): INR, PROTIME in the last 168 hours.  CBC: Recent Labs  Lab 07/01/19 2017 07/02/19 0436  WBC 5.8 5.6  HGB 12.7 11.3*  HCT 40.8 35.4*  MCV 97.1 96.2  PLT 291 272   Cardiac Enzymes: No results for input(s):  CKTOTAL, CKMB, CKMBINDEX, TROPONINI in the last 168 hours. BNP (last 3 results) No results for input(s): PROBNP in the last 8760 hours. CBG: No results for input(s): GLUCAP in the last 168 hours. D-Dimer: No results for input(s): DDIMER in the last 72 hours. Hgb A1c: No results for input(s): HGBA1C in the last 72 hours. Lipid Profile: No results for input(s): CHOL, HDL, LDLCALC, TRIG, CHOLHDL, LDLDIRECT in the last 72 hours. Thyroid function studies: No results for input(s): TSH, T4TOTAL, T3FREE, THYROIDAB in the last 72 hours.  Invalid input(s): FREET3 Anemia work up: No results for input(s): VITAMINB12, FOLATE, FERRITIN, TIBC, IRON, RETICCTPCT in the last 72 hours. Sepsis Labs: Recent Labs  Lab 07/01/19 2017 07/02/19 0436  WBC 5.8 5.6    Microbiology Recent Results (from the past 240 hour(s))  SARS CORONAVIRUS 2 (TAT 6-24 HRS) Nasopharyngeal Nasopharyngeal Swab     Status: None   Collection Time: 07/02/19 12:59 AM   Specimen: Nasopharyngeal Swab  Result Value Ref Range Status   SARS Coronavirus 2 NEGATIVE NEGATIVE Final    Comment: (NOTE) SARS-CoV-2 target nucleic acids are NOT DETECTED. The SARS-CoV-2 RNA is generally detectable in upper and lower respiratory specimens during the acute phase of infection. Negative results do not preclude SARS-CoV-2 infection, do not rule out co-infections with other pathogens, and should not be used as the sole basis for treatment or other patient management decisions. Negative results must be combined with clinical observations, patient history, and epidemiological information. The expected result is Negative. Fact Sheet for Patients: SugarRoll.be Fact Sheet for Healthcare Providers: https://www.woods-mathews.com/ This test is not yet approved or cleared by the [place] FDA and  has been authorized for detection and/or diagnosis of SARS-CoV-2 by FDA under an Emergency Use Authorization  (EUA). This EUA will remain  in effect (meaning this test can be used) for the duration of the COVID-19 declaration under Section 56 4(b)(1) of the Act, 21 U.S.C. section 360bbb-3(b)(1), unless the authorization is terminated or revoked sooner. Performed at [facility], [place] [address] [place], [place] [address]     Procedures and diagnostic studies:  DG Chest 2 View  Result Date: 07/01/2019 CLINICAL DATA:  Shortness of breath.  History of lupus. EXAM: CHEST - 2 VIEW COMPARISON:  September 06, 2018 FINDINGS: Bilateral interstitial opacities are identified. Increased patchy opacity in the right base and left mid lung/base, increased since 2020. No pneumothorax. The cardiomediastinal silhouette is stable. No other acute abnormalities. IMPRESSION: 1. Bilateral pulmonary opacities. There is certainly a chronic component. The increased in the interval  could represent worsening interstitial lung disease. However, superimposed developing infiltrates/pneumonia are not excluded. Electronically Signed   By: Gerome Sam III M.D   On: 07/01/2019 16:24    Medications:   . enoxaparin (LOVENOX) injection  40 mg Subcutaneous Daily  . fluticasone furoate-vilanterol  1 puff Inhalation Daily  . multivitamin with minerals  1 tablet Oral Daily  . sodium chloride flush  3 mL Intravenous Once   Continuous Infusions: . azithromycin    . cefTRIAXone (ROCEPHIN)  IV    . sodium chloride       LOS: 0 days   Joseph Art  [facility]   How to contact the [facility] Attending or Consulting provider 7A - 7P or covering provider during after hours 7P -7A, for this patient?  1. Check the care team in [facility] and look for a) attending/consulting TRH provider listed and b) the [facility] team listed 2. Log into [email or web address] and use [place]'s universal password to access. If you do not have the password, please contact the hospital operator. 3. Locate the [facility] provider you are looking for under [facility] and page to a number that you can be directly reached. 4. If you still have difficulty reaching the provider, please page the [facility] (Director on Call) for the Hospitalists listed on amion for assistance.  07/02/2019, 12:23 PM

## 2019-07-02 NOTE — Plan of Care (Signed)

## 2019-07-02 NOTE — Plan of Care (Signed)

## 2019-07-02 NOTE — Progress Notes (Signed)
Attempted to insert PIV on pt's right AC but failed to thread.  Veins to upper arms are deep---  EDP was made aware and was suggested to order a Midline but EDP did not want a Midline at this time.

## 2019-07-03 LAB — BASIC METABOLIC PANEL
Anion gap: 9 (ref 5–15)
BUN: <5 mg/dL — ABNORMAL LOW (ref 6–20)
CO2: 25 mmol/L (ref 22–32)
Calcium: 8.6 mg/dL — ABNORMAL LOW (ref 8.9–10.3)
Chloride: 103 mmol/L (ref 98–111)
Creatinine, Ser: 0.42 mg/dL — ABNORMAL LOW (ref 0.44–1.00)
GFR calc Af Amer: >60 mL/min (ref >60)
GFR calc non Af Amer: >60 mL/min (ref >60)
Glucose, Bld: 97 mg/dL (ref 70–99)
Potassium: 2.9 mmol/L — ABNORMAL LOW (ref 3.5–5.1)
Sodium: 137 mmol/L (ref 135–145)

## 2019-07-03 LAB — CBC
HCT: 34.1 % — ABNORMAL LOW (ref 36.0–46.0)
Hemoglobin: 10.8 g/dL — ABNORMAL LOW (ref 12.0–15.0)
MCH: 30.7 pg (ref 26.0–34.0)
MCHC: 31.7 g/dL (ref 30.0–36.0)
MCV: 96.9 fL (ref 80.0–100.0)
Platelets: 242 10*3/uL (ref 150–400)
RBC: 3.52 MIL/uL — ABNORMAL LOW (ref 3.87–5.11)
RDW: 14.6 % (ref 11.5–15.5)
WBC: 4 10*3/uL (ref 4.0–10.5)
nRBC: 0 % (ref 0.0–0.2)

## 2019-07-03 MED ORDER — DICLOFENAC SODIUM 1 % EX GEL
2.0000 g | Freq: Four times a day (QID) | CUTANEOUS | Status: DC
  Administered 2019-07-03 – 2019-07-05 (×5): 2 g via TOPICAL
  Filled 2019-07-03: qty 100

## 2019-07-03 MED ORDER — POTASSIUM CHLORIDE CRYS ER 20 MEQ PO TBCR
40.0000 meq | EXTENDED_RELEASE_TABLET | Freq: Once | ORAL | Status: AC
  Administered 2019-07-03: 40 meq via ORAL
  Filled 2019-07-03: qty 2

## 2019-07-03 MED ORDER — HYDROMORPHONE HCL 1 MG/ML IJ SOLN: 0.5000 mg | INTRAMUSCULAR | Status: DC | PRN

## 2019-07-03 MED ORDER — PANTOPRAZOLE SODIUM 40 MG PO TBEC
40.0000 mg | DELAYED_RELEASE_TABLET | Freq: Every day | ORAL | Status: DC
  Administered 2019-07-03 – 2019-07-05 (×3): 40 mg via ORAL
  Filled 2019-07-03 (×3): qty 1

## 2019-07-03 NOTE — Plan of Care (Signed)

## 2019-07-03 NOTE — Plan of Care (Signed)
  Problem: Activity: Goal: Risk for activity intolerance will decrease Outcome: Progressing   Problem: Coping: Goal: Level of anxiety will decrease Outcome: Progressing   Problem: Elimination: Goal: Will not experience complications related to bowel motility Outcome: Progressing Goal: Will not experience complications related to urinary retention Outcome: Progressing   Problem: Elimination: Goal: Will not experience complications related to urinary retention Outcome: Progressing   Problem: Pain Managment: Goal: General experience of comfort will improve Outcome: Progressing   Problem: Safety: Goal: Ability to remain free from injury will improve Outcome: Progressing   

## 2019-07-03 NOTE — Progress Notes (Signed)
Progress Note    Lisa Foley  VZS:[id number] DOB: 1970/12/07  DOA: 07/01/2019 PCP: Patient, No Pcp Per    Brief Narrative:    Medical records reviewed and are as summarized below:  Lisa Foley is an 49 y.o. female BIB [facility] with multiple complaints. Pt c/o increased shortness of breath, nausea/vomiting/diarhea and runny nose x 1 week.  Assessment/Plan:   Principal Problem:   Hypokalemia Active Problems:   Lupus (HCC)   Nausea and vomiting   Diarrhea   Grief   MDD (major depressive disorder), single episode, moderate (HCC)   Hypomagnesemia   Possible PNA -Doubt pneumonia as patient has a negative procalcitonin, no fever, no white blood cell count -Will DC antibiotics -We will add pulmonary toilet and incentive spirometer as patient does say she has some mucus production  Hypokalemia Hypomagnesemia  Nausea/Vomiting/Diarrhea - cont aggressive mag and K replacement and recheck  - Zofran -Vomiting seems to be resolved but patient vacillating between bloody stools and watery diarrhea: We will change to regular diet -We will order GI pathogen panel -Both nurse and I have instructed patient to call nursing so they can see her stools prior to flushing  Lupus  Chronic Pain Chronic Itching - not currently on Plaquenil as she is transitioning between providers; encouraged f/u - Dilaudid, Flexeril, Tylenol, Benadryl PRN, heating pad - cont PTA inhaler   Grief MDD, moderate, first episode - reports symptoms started in 2020-07-27after her mother died and she has not been able to "bounce back" or continue to enjoy the activities she used to enjoy - reports wanting to "be with her mother" but denies SI/HI - She is interested in starting a medication for depression; due to GI symptoms normally caused with initiation of SSRI, will hold for now, but recommend initiation once GI symptoms improved so as to not exacerbate current symptoms  -Will need close  outpatient follow-up   Family Communication/Anticipated D/C date and plan/Code Status   DVT prophylaxis: Lovenox ordered. Code Status: Full Code.  Disposition Plan:  Status is: Inpatient  Remains inpatient appropriate because:Inpatient level of care appropriate due to severity of illness   Dispo: The patient is from: Home              Anticipated d/c is to: Home              Anticipated d/c date is: 2 days              Patient currently is not medically stable to d/c.       Medical Consultants:    None.     Subjective:   not reporting blood in her stools currently but reporting watery diarrhea  Objective:    Vitals:   07/02/19 1954 07/03/19 0502 07/03/19 0745 07/03/19 1243  BP: (!) 111/58 99/66  105/60  Pulse: 75 75  75  Resp: 16 17 16 18   Temp: 98.5 F (36.9 C) 97.7 F (36.5 C)  98.6 F (37 C)  TempSrc: Oral Oral  Oral  SpO2: 94% 91% 92% 98%    Intake/Output Summary (Last 24 hours) at 07/03/2019 1254 Last data filed at 07/03/2019 1100 Gross per 24 hour  Intake 461.11 ml  Output --  Net 461.11 ml   There were no vitals filed for this visit.  Exam:  General: Appearance:    Overweight female in no acute distress     Lungs:     Diminished with out wheezing, respirations unlabored  Heart:  Normal heart rate. Normal rhythm. No murmurs, rubs, or gallops.   MS:   All extremities are intact.   Neurologic:   Awake, alert, oriented x 3. No apparent focal neurological           defect.     Data Reviewed:   I have personally reviewed following labs and imaging studies:  Labs: Labs show the following:   Basic Metabolic Panel: Recent Labs  Lab 07/01/19 2017 07/01/19 2017 07/02/19 0040 07/02/19 0436 07/03/19 0746  NA 133*  --   --  138 137  K 2.4*   < >  --  3.3* 2.9*  CL 98  --   --  105 103  CO2 26  --   --  24 25  GLUCOSE 84  --   --  99 97  BUN <5*  --   --  <5* <5*  CREATININE 0.39*  --   --  0.41* 0.42*  CALCIUM 8.8*  --   --  8.4*  8.6*  MG 1.6*  --   --  1.5*  --   PHOS  --   --  2.6  --   --    < > = values in this interval not displayed.   GFR CrCl cannot be calculated (Unknown ideal weight.). Liver Function Tests: Recent Labs  Lab 07/01/19 2017 07/02/19 0436  AST 38 35  ALT 29 26  ALKPHOS 45 37*  BILITOT 1.0 1.1  PROT 8.7* 7.5  ALBUMIN 3.2* 2.7*   Recent Labs  Lab 07/01/19 2017  LIPASE 19   No results for input(s): AMMONIA in the last 168 hours. Coagulation profile No results for input(s): INR, PROTIME in the last 168 hours.  CBC: Recent Labs  Lab 07/01/19 2017 07/02/19 0436 07/03/19 0746  WBC 5.8 5.6 4.0  HGB 12.7 11.3* 10.8*  HCT 40.8 35.4* 34.1*  MCV 97.1 96.2 96.9  PLT 291 272 242   Cardiac Enzymes: No results for input(s): CKTOTAL, CKMB, CKMBINDEX, TROPONINI in the last 168 hours. BNP (last 3 results) No results for input(s): PROBNP in the last 8760 hours. CBG: No results for input(s): GLUCAP in the last 168 hours. D-Dimer: No results for input(s): DDIMER in the last 72 hours. Hgb A1c: No results for input(s): HGBA1C in the last 72 hours. Lipid Profile: No results for input(s): CHOL, HDL, LDLCALC, TRIG, CHOLHDL, LDLDIRECT in the last 72 hours. Thyroid function studies: No results for input(s): TSH, T4TOTAL, T3FREE, THYROIDAB in the last 72 hours.  Invalid input(s): FREET3 Anemia work up: No results for input(s): VITAMINB12, FOLATE, FERRITIN, TIBC, IRON, RETICCTPCT in the last 72 hours. Sepsis Labs: Recent Labs  Lab 07/01/19 2017 07/02/19 0436 07/02/19 1228 07/03/19 0746  PROCALCITON  --   --  <0.10  --   WBC 5.8 5.6  --  4.0    Microbiology Recent Results (from the past 240 hour(s))  SARS CORONAVIRUS 2 (TAT 6-24 HRS) Nasopharyngeal Nasopharyngeal Swab     Status: None   Collection Time: 07/02/19 12:59 AM   Specimen: Nasopharyngeal Swab  Result Value Ref Range Status   SARS Coronavirus 2 NEGATIVE NEGATIVE Final    Comment: (NOTE) SARS-CoV-2 target nucleic  acids are NOT DETECTED. The SARS-CoV-2 RNA is generally detectable in upper and lower respiratory specimens during the acute phase of infection. Negative results do not preclude SARS-CoV-2 infection, do not rule out co-infections with other pathogens, and should not be used as the sole basis for treatment or other patient  management decisions. Negative results must be combined with clinical observations, patient history, and epidemiological information. The expected result is Negative. Fact Sheet for Patients: SugarRoll.be Fact Sheet for Healthcare Providers: https://www.woods-mathews.com/ This test is not yet approved or cleared by the [place] FDA and  has been authorized for detection and/or diagnosis of SARS-CoV-2 by FDA under an Emergency Use Authorization (EUA). This EUA will remain  in effect (meaning this test can be used) for the duration of the COVID-19 declaration under Section 56 4(b)(1) of the Act, 21 U.S.C. section 360bbb-3(b)(1), unless the authorization is terminated or revoked sooner. Performed at [facility], [place] [address], [place], [place] [address]     Procedures and diagnostic studies:  DG Chest 2 View  Result Date: 07/01/2019 CLINICAL DATA:  Shortness of breath.  History of lupus. EXAM: CHEST - 2 VIEW COMPARISON:  September 06, 2018 FINDINGS: Bilateral interstitial opacities are identified. Increased patchy opacity in the right base and left mid lung/base, increased since 2020. No pneumothorax. The cardiomediastinal silhouette is stable. No other acute abnormalities. IMPRESSION: 1. Bilateral pulmonary opacities. There is certainly a chronic component. The increased in the interval could represent worsening interstitial lung disease. However, superimposed developing infiltrates/pneumonia are not excluded. Electronically Signed   By: Dorise Bullion III M.D   On: 07/01/2019 16:24    Medications:   . diclofenac  Sodium  2 g Topical QID  . enoxaparin (LOVENOX) injection  40 mg Subcutaneous Daily  . fluticasone furoate-vilanterol  1 puff Inhalation Daily  . multivitamin with minerals  1 tablet Oral Daily  . potassium chloride  40 mEq Oral Once  . sodium chloride flush  3 mL Intravenous Once   Continuous Infusions: . sodium chloride       LOS: 0 days   [facility]   How to contact the [facility] Attending or Consulting provider [place] or covering provider during after hours [place], for this patient?  1. Check the care team in [facility] and look for a) attending/consulting TRH provider listed and b) the [facility] team listed 2. Log into [email or web address] and use [place]'s universal password to access. If you do not have the password, please contact the hospital operator. 3. Locate the [facility] provider you are looking for under [facility] and page to a number that you can be directly reached. 4. If you still have difficulty reaching the provider, please page the [facility] (Director on Call) for the Hospitalists listed on amion for assistance.  07/03/2019, 12:54 PM

## 2019-07-04 LAB — BASIC METABOLIC PANEL
Anion gap: 5 (ref 5–15)
BUN: 5 mg/dL — ABNORMAL LOW (ref 6–20)
CO2: 25 mmol/L (ref 22–32)
Calcium: 8.4 mg/dL — ABNORMAL LOW (ref 8.9–10.3)
Chloride: 106 mmol/L (ref 98–111)
Creatinine, Ser: 0.4 mg/dL — ABNORMAL LOW (ref 0.44–1.00)
GFR calc Af Amer: >60 mL/min (ref >60)
GFR calc non Af Amer: >60 mL/min (ref >60)
Glucose, Bld: 89 mg/dL (ref 70–99)
Potassium: 3.7 mmol/L (ref 3.5–5.1)
Sodium: 136 mmol/L (ref 135–145)

## 2019-07-04 LAB — CBC
HCT: 33.4 % — ABNORMAL LOW (ref 36.0–46.0)
Hemoglobin: 10.4 g/dL — ABNORMAL LOW (ref 12.0–15.0)
MCH: 30 pg (ref 26.0–34.0)
MCHC: 31.1 g/dL (ref 30.0–36.0)
MCV: 96.3 fL (ref 80.0–100.0)
Platelets: 213 10*3/uL (ref 150–400)
RBC: 3.47 MIL/uL — ABNORMAL LOW (ref 3.87–5.11)
RDW: 14.6 % (ref 11.5–15.5)
WBC: 4.5 10*3/uL (ref 4.0–10.5)
nRBC: 0 % (ref 0.0–0.2)

## 2019-07-04 LAB — GASTROINTESTINAL PANEL BY PCR, STOOL (REPLACES STOOL CULTURE)

## 2019-07-04 LAB — MAGNESIUM: Magnesium: 1.4 mg/dL — ABNORMAL LOW (ref 1.7–2.4)

## 2019-07-04 MED ORDER — MAGNESIUM SULFATE 2 GM/50ML IV SOLN
2.0000 g | Freq: Once | INTRAVENOUS | Status: AC
  Administered 2019-07-04: 2 g via INTRAVENOUS
  Filled 2019-07-04 (×2): qty 50

## 2019-07-04 NOTE — TOC Initial Note (Addendum)
Transition of Care [facility]) - Initial/Assessment Note    Patient Details  Name: Lisa Foley MRN: [id number] Date of Birth: Mar 25, 1970  [facility]) CM/SW Contact:    Curlene Labrum, RN Phone Number: 07/04/2019, 9:52 AM  Clinical Narrative:                 Lisa Foley is an 49 y.o. female BIB [facility] with multiple complaints. Pt c/o increased shortness of breath, nausea/vomiting/diarhea and runny nose x 1 week.  Patient states that she currently lives with her husband, three adult children and grandchildren in the household.  Patient verbalizes sadness over the loss of her mother from [place] in the last year and expresses anxiety over the grief and loss.  Spoke with the patient and patient set up with [place] for primary care follow up after this hospitalization and possible outpatient counseling for grief. Will continue to follow for needs for discharge.  PCP appointment made with Transitional Care clinica at [place] for Wednesday, May 5 at 1050.  This clinical has access for CSW as well for possible followup for grief counseling.    Chaplain at [facility] called to make a visit with the patient.  Expected Discharge Plan: Home/Self Care     Patient Goals and CMS Choice Patient states their goals for this hospitalization and ongoing recovery are:: Patient complains of nausea since Friday and symptoms of depression over the loss of her mother due to [place].      Expected Discharge Plan and Services Expected Discharge Plan: Home/Self Care   Discharge Planning Services: CM Consult   Living arrangements for the past 2 months: Single Family Home                                      Prior Living Arrangements/Services Living arrangements for the past 2 months: Single Family Home Lives with:: Adult Children, Spouse Patient language and need for interpreter reviewed:: Yes Do you feel  safe going back to the place where you live?: Yes      Need for Family Participation in Patient Care: Yes (Comment) Care giver support system in place?: Yes (comment)   Criminal Activity/Legal Involvement Pertinent to Current Situation/Hospitalization: No - Comment as needed  Activities of Daily Living Home Assistive Devices/Equipment: None ADL Screening (condition at time of admission) Patient's cognitive ability adequate to safely complete daily activities?: Yes Is the patient deaf or have difficulty hearing?: No Does the patient have difficulty seeing, even when wearing glasses/contacts?: No Does the patient have difficulty concentrating, remembering, or making decisions?: No Patient able to express need for assistance with ADLs?: Yes Does the patient have difficulty dressing or bathing?: No Independently performs ADLs?: Yes (appropriate for developmental age) Does the patient have difficulty walking or climbing stairs?: Yes Weakness of Legs: Both Weakness of Arms/Hands: None  Permission Sought/Granted Permission sought to share information with : Case Manager Permission granted to share information with : Yes, Verbal Permission Granted              Emotional Assessment Appearance:: Appears stated age Attitude/Demeanor/Rapport: Grieving Affect (typically observed): Accepting Orientation: : Oriented to Self, Oriented to Place, Oriented to  Time, Oriented to Situation Alcohol / Substance Use: Tobacco Use, Alcohol Use Psych Involvement: No (comment)  Admission diagnosis:  Hypokalemia [E87.6] Vomiting and diarrhea [R11.10, R19.7] Patient Active Problem List  Diagnosis Date Noted  . Hypokalemia 07/02/2019  . Nausea and vomiting 07/02/2019  . Diarrhea 07/02/2019  . Grief 07/02/2019  . MDD (major depressive disorder), single episode, moderate (HCC) 07/02/2019  . Hypomagnesemia 07/02/2019  . Lupus (HCC) 06/08/2015  . Nicotine dependence, cigarettes, uncomplicated 06/08/2015   . Carpal tunnel syndrome   . Vitamin D deficiency 01/01/2010  . Osteoarthritis of right hip 06/28/2009   PCP:  Patient, No Pcp Per Pharmacy:   [id number] [place], [place] - [phone] AT [facility] [address] [place] [address] Phone: [phone] Fax: [phone]     Social Determinants of Health (SDOH) Interventions    Readmission Risk Interventions Readmission Risk Prevention Plan 07/04/2019  Post Dischage Appt Complete  Medication Screening Complete  Transportation Screening Complete  Some recent data might be hidden

## 2019-07-04 NOTE — Progress Notes (Signed)
Chaplain responded to Spiritual Consult for Ms. Lisa Foley who is depressed.  Chaplain offered ministry of presence and listened as Ms. Lisa Foley spoke of her burdens; the primary one being her living situation is risky and she fears being evicted.  Ms. Lisa Foley spoke of her employer receiving Covid Relief money for employees but not paying any of the employees the money she received.  Ms. Lisa Foley has fallen behind in her bills and can't pay her rent.  She doesn't know what step to take next.  She is sick with Lupus and is out of resources.  Chaplain prayed with Ms. Lisa Foley asking the Lord to provide evidence of the next step Ms. Lisa Foley is to take and to bring her some monetary relief so she can focus on getting well. Chaplain invited Ms. Lisa Foley to call Chaplain back if she needed to talk some more.  Vernell Morgans Chaplain Resident

## 2019-07-04 NOTE — Plan of Care (Signed)

## 2019-07-04 NOTE — Progress Notes (Signed)
Progress Note    Lisa Foley  NWG:[id number] DOB: 1970-11-03  DOA: 07/01/2019 PCP: Patient, No Pcp Per    Brief Narrative:    Medical records reviewed and are as summarized below:  Lisa Foley is an 49 y.o. female BIB [facility] with multiple complaints. Pt c/o increased shortness of breath, nausea/vomiting/diarhea and runny nose x 1 week.  Assessment/Plan:   Principal Problem:   Hypokalemia Active Problems:   Lupus (HCC)   Nausea and vomiting   Diarrhea   Grief   MDD (major depressive disorder), single episode, moderate (HCC)   Hypomagnesemia   Possible PNA -Doubt pneumonia as patient has a negative procalcitonin, no fever, no white blood cell count -Will DC antibiotics -pulmonary toilet and incentive spirometer as patient does say she has some mucus production  Hypokalemia Hypomagnesemia  Nausea/Vomiting/Diarrhea - cont aggressive mag and K replacement and recheck  - Zofran -Vomiting seems to be resolved but patient vacillating between bloody stools and watery diarrhea: We will change to regular diet -GI pathogen pending  Lupus  Chronic Pain Chronic Itching - not currently on Plaquenil as she is transitioning between providers; encouraged f/u - Dilaudid, Flexeril, Tylenol, Benadryl PRN, heating pad - cont PTA inhaler   Grief MDD, moderate, first episode - reports symptoms started in 07-24-20after her mother died and she has not been able to "bounce back" or continue to enjoy the activities she used to enjoy - reports wanting to "be with her mother" but denies SI/HI - She is interested in starting a medication for depression; due to GI symptoms normally caused with initiation of SSRI, will hold for now, but recommend initiation once GI symptoms improved so as to not exacerbate current symptoms  -Will need close outpatient follow-up   Family Communication/Anticipated D/C date and plan/Code Status   DVT prophylaxis: Lovenox ordered. Code  Status: Full Code.  Disposition Plan:  Status is: Inpatient  Remains inpatient appropriate because:Inpatient level of care appropriate due to severity of illness   Dispo: The patient is from: Home              Anticipated d/c is to: Home              Anticipated d/c date is: in AM              Patient currently is not medically stable to d/c.       Medical Consultants:    None.     Subjective:   On phone and would not hang up  Objective:    Vitals:   07/03/19 1243 07/03/19 2010 07/04/19 0417 07/04/19 0803  BP: 105/60 112/64 119/64   Pulse: 75 84 71 77  Resp: 18 16 15 16   Temp: 98.6 F (37 C) 98.3 F (36.8 C) 98.2 F (36.8 C)   TempSrc: Oral Oral Oral   SpO2: 98% 96% 97% 98%    Intake/Output Summary (Last 24 hours) at 07/04/2019 1249 Last data filed at 07/04/2019 0900 Gross per 24 hour  Intake 1319.44 ml  Output 950 ml  Net 369.44 ml   There were no vitals filed for this visit.  Exam:  General: Appearance:    Well developed, well nourished female in no acute distress     Lungs:      respirations unlabored     MS:   All extremities are intact.   Neurologic:   Awake, alert, oriented x 3. No apparent focal neurological  defect.     Data Reviewed:   I have personally reviewed following labs and imaging studies:  Labs: Labs show the following:   Basic Metabolic Panel: Recent Labs  Lab 07/01/19 2017 07/01/19 2017 07/02/19 0040 07/02/19 0436 07/02/19 0436 07/03/19 0746 07/04/19 0412  NA 133*  --   --  138  --  137 136  K 2.4*   < >  --  3.3*   < > 2.9* 3.7  CL 98  --   --  105  --  103 106  CO2 26  --   --  24  --  25 25  GLUCOSE 84  --   --  99  --  97 89  BUN <5*  --   --  <5*  --  <5* 5*  CREATININE 0.39*  --   --  0.41*  --  0.42* 0.40*  CALCIUM 8.8*  --   --  8.4*  --  8.6* 8.4*  MG 1.6*  --   --  1.5*  --   --  1.4*  PHOS  --   --  2.6  --   --   --   --    < > = values in this interval not displayed.   GFR CrCl cannot  be calculated (Unknown ideal weight.). Liver Function Tests: Recent Labs  Lab 07/01/19 2017 07/02/19 0436  AST 38 35  ALT 29 26  ALKPHOS 45 37*  BILITOT 1.0 1.1  PROT 8.7* 7.5  ALBUMIN 3.2* 2.7*   Recent Labs  Lab 07/01/19 2017  LIPASE 19   No results for input(s): AMMONIA in the last 168 hours. Coagulation profile No results for input(s): INR, PROTIME in the last 168 hours.  CBC: Recent Labs  Lab 07/01/19 2017 07/02/19 0436 07/03/19 0746 07/04/19 0412  WBC 5.8 5.6 4.0 4.5  HGB 12.7 11.3* 10.8* 10.4*  HCT 40.8 35.4* 34.1* 33.4*  MCV 97.1 96.2 96.9 96.3  PLT 291 272 242 213   Cardiac Enzymes: No results for input(s): CKTOTAL, CKMB, CKMBINDEX, TROPONINI in the last 168 hours. BNP (last 3 results) No results for input(s): PROBNP in the last 8760 hours. CBG: No results for input(s): GLUCAP in the last 168 hours. D-Dimer: No results for input(s): DDIMER in the last 72 hours. Hgb A1c: No results for input(s): HGBA1C in the last 72 hours. Lipid Profile: No results for input(s): CHOL, HDL, LDLCALC, TRIG, CHOLHDL, LDLDIRECT in the last 72 hours. Thyroid function studies: No results for input(s): TSH, T4TOTAL, T3FREE, THYROIDAB in the last 72 hours.  Invalid input(s): FREET3 Anemia work up: No results for input(s): VITAMINB12, FOLATE, FERRITIN, TIBC, IRON, RETICCTPCT in the last 72 hours. Sepsis Labs: Recent Labs  Lab 07/01/19 2017 07/02/19 0436 07/02/19 1228 07/03/19 0746 07/04/19 0412  PROCALCITON  --   --  <0.10  --   --   WBC 5.8 5.6  --  4.0 4.5    Microbiology Recent Results (from the past 240 hour(s))  SARS CORONAVIRUS 2 (TAT 6-24 HRS) Nasopharyngeal Nasopharyngeal Swab     Status: None   Collection Time: 07/02/19 12:59 AM   Specimen: Nasopharyngeal Swab  Result Value Ref Range Status   SARS Coronavirus 2 NEGATIVE NEGATIVE Final    Comment: (NOTE) SARS-CoV-2 target nucleic acids are NOT DETECTED. The SARS-CoV-2 RNA is generally detectable in  upper and lower respiratory specimens during the acute phase of infection. Negative results do not preclude SARS-CoV-2 infection, do not rule out co-infections  with other pathogens, and should not be used as the sole basis for treatment or other patient management decisions. Negative results must be combined with clinical observations, patient history, and epidemiological information. The expected result is Negative. Fact Sheet for Patients: SugarRoll.be Fact Sheet for Healthcare Providers: https://www.woods-mathews.com/ This test is not yet approved or cleared by the [place] FDA and  has been authorized for detection and/or diagnosis of SARS-CoV-2 by FDA under an Emergency Use Authorization (EUA). This EUA will remain  in effect (meaning this test can be used) for the duration of the COVID-19 declaration under Section 56 4(b)(1) of the Act, 21 U.S.C. section 360bbb-3(b)(1), unless the authorization is terminated or revoked sooner. Performed at [facility], [place] [address] [place], [place] [address]     Procedures and diagnostic studies:  No results found.  Medications:   . diclofenac Sodium  2 g Topical QID  . enoxaparin (LOVENOX) injection  40 mg Subcutaneous Daily  . fluticasone furoate-vilanterol  1 puff Inhalation Daily  . multivitamin with minerals  1 tablet Oral Daily  . pantoprazole  40 mg Oral Daily  . sodium chloride flush  3 mL Intravenous Once   Continuous Infusions: . sodium chloride       LOS: 1 day   Geradine Girt  [facility]   How to contact the [facility] Attending or Consulting provider [place] or covering provider during after hours [place], for this patient?  1. Check the care team in [facility] and look for a) attending/consulting [facility] provider listed and b) the [facility] team listed 2. Log into [email or web address] and use [place]'s universal password to access. If you do not have the password, please contact  the hospital operator. 3. Locate the [facility] provider you are looking for under [facility] and page to a number that you can be directly reached. 4. If you still have difficulty reaching the provider, please page the [facility] (Director on Call) for the Hospitalists listed on amion for assistance.  07/04/2019, 12:49 PM

## 2019-07-04 NOTE — Plan of Care (Signed)
  Problem: Activity: Goal: Risk for activity intolerance will decrease Outcome: Progressing   Problem: Coping: Goal: Level of anxiety will decrease Outcome: Progressing   Problem: Pain Managment: Goal: General experience of comfort will improve Outcome: Progressing   Problem: Safety: Goal: Ability to remain free from injury will improve Outcome: Progressing   

## 2019-07-05 LAB — BASIC METABOLIC PANEL
Anion gap: 6 (ref 5–15)
BUN: 6 mg/dL (ref 6–20)
CO2: 28 mmol/L (ref 22–32)
Calcium: 8.6 mg/dL — ABNORMAL LOW (ref 8.9–10.3)
Chloride: 104 mmol/L (ref 98–111)
Creatinine, Ser: 0.5 mg/dL (ref 0.44–1.00)
GFR calc Af Amer: >60 mL/min (ref >60)
GFR calc non Af Amer: >60 mL/min (ref >60)
Glucose, Bld: 94 mg/dL (ref 70–99)
Potassium: 3.9 mmol/L (ref 3.5–5.1)
Sodium: 138 mmol/L (ref 135–145)

## 2019-07-05 LAB — MAGNESIUM: Magnesium: 1.6 mg/dL — ABNORMAL LOW (ref 1.7–2.4)

## 2019-07-05 MED ORDER — MAGNESIUM SULFATE 2 GM/50ML IV SOLN
2.0000 g | Freq: Once | INTRAVENOUS | Status: AC
  Administered 2019-07-05: 2 g via INTRAVENOUS
  Filled 2019-07-05: qty 50

## 2019-07-05 NOTE — Progress Notes (Signed)
Provided discharge education/instructions, all questions and concerns addressed, Pt not in distress, discharged home with belongings accompanied by family. 

## 2019-07-05 NOTE — Plan of Care (Signed)

## 2019-07-05 NOTE — Plan of Care (Signed)
  Problem: Activity: Goal: Risk for activity intolerance will decrease Outcome: Progressing   Problem: Coping: Goal: Level of anxiety will decrease Outcome: Progressing   Problem: Pain Managment: Goal: General experience of comfort will improve Outcome: Progressing   Problem: Safety: Goal: Ability to remain free from injury will improve Outcome: Progressing   

## 2019-07-05 NOTE — Discharge Summary (Signed)
Physician Discharge Summary  Lisa Foley [id number]:[id number] DOB: 03-30-1970 DOA: 07/01/2019  PCP: Patient, No Pcp Per  Admit date: 07/01/2019 Discharge date: 07/05/2019  Admitted From: Home Disposition: Home  Recommendations for Outpatient Follow-up:  1. Follow up with PCP in 1-2 weeks 2. Please obtain BMP/CBC in one week 3. Please follow up on the following pending results:  Home Health: None Equipment/Devices: None  Discharge Condition: Stable CODE STATUS: Full code Diet recommendation: Regular  Subjective: Seen and examined.  Feels overall better.  The only complaint she has today is tiredness.  No nausea, vomiting, abdominal pain or diarrhea.  Agreeable to go home.  Brief/Interim Summary: Lisa Foley is a 49 y.o. female with PMH of SLE presented to ED with nausea, vomiting and diarrhea and admitted for hypokalemia.  GI pathogen was negative.  Procalcitonin negative.  Chest x-ray showed possible infiltrates bilaterally however she was not having any symptoms of pneumonia such as shortness of breath, fever, leukocytosis and procalcitonin was also unremarkable.  She was tested negative for COVID-19 pneumonia as well so her antibiotics which were initiated at the time of admission were discontinued.Marland Kitchen  UA showed some leukocytes but no nitrites and she did not have any urinary symptoms and no bacteria seen on UA.  Along with hypokalemia, she was also found to have hypomagnesemia.  Her electrolytes were repleted.  She was also going through grief and reported that her symptoms started in 08/09/20after her mother died and she has not been able to bounce back or continue to enjoy the activities she used to enjoy however she denied any SI/HI.  She is interested in initiation of medications for depression however due to recent episode of diarrhea, this was not initiated.  Will defer to PCP to start her on antidepressants.  Recommend close follow-up.  Now that she is feeling good and is  hemodynamically stable so she is being discharged home.  Her magnesium is 1.6 this morning.  She has received 2 g of IV magnesium sulfate today.  She is agreeable with the discharge plan.  Discharge Diagnoses:  Principal Problem:   Hypokalemia Active Problems:   Lupus (HCC)   Nausea and vomiting   Diarrhea   Grief   MDD (major depressive disorder), single episode, moderate (HCC)   Hypomagnesemia    Discharge Instructions   Allergies as of 07/05/2019      Reactions   Advair Hfa [fluticasone-salmeterol] Swelling   Aleve [naproxen Sodium] Swelling      Medication List    TAKE these medications   acetaminophen 500 MG tablet Commonly known as: TYLENOL Take 2 tablets (1,000 mg total) by mouth every 8 (eight) hours as needed for mild pain. Do NOT exceed 4,000mg  in 24 hours What changed:   how much to take  when to take this  reasons to take this   budesonide-formoterol 160-4.5 MCG/ACT inhaler Commonly known as: Symbicort Inhale 2 puffs into the lungs 2 (two) times daily.      Follow-up Information    [facility]. Go on 07/13/2019.   Why: You are scheduled for a follow up appointment with your new Primary Care provider at this clinic.  Your appointment is scheduled for Wednesday, May 5 at 1050.  There is a Visual merchandiser at the clinic to provide assistance with grief counseling. Contact information: [address] [place] [address] [phone]         Allergies  Allergen Reactions  . Advair Hfa [  Fluticasone-Salmeterol] Swelling  . Aleve [Naproxen Sodium] Swelling    Consultations: None   Procedures/Studies: DG Chest 2 View  Result Date: 07/01/2019 CLINICAL DATA:  Shortness of breath.  History of lupus. EXAM: CHEST - 2 VIEW COMPARISON:  September 06, 2018 FINDINGS: Bilateral interstitial opacities are identified. Increased patchy opacity in the right base and left mid lung/base, increased since 2020. No  pneumothorax. The cardiomediastinal silhouette is stable. No other acute abnormalities. IMPRESSION: 1. Bilateral pulmonary opacities. There is certainly a chronic component. The increased in the interval could represent worsening interstitial lung disease. However, superimposed developing infiltrates/pneumonia are not excluded. Electronically Signed   By: Gerome Sam III M.D   On: 07/01/2019 16:24      Discharge Exam: Vitals:   07/05/19 0744 07/05/19 0846  BP: 120/83   Pulse: 79   Resp: 16   Temp: 97.7 F (36.5 C)   SpO2: 97% 97%   Vitals:   07/04/19 2000 07/05/19 0417 07/05/19 0744 07/05/19 0846  BP: 114/72 128/72 120/83   Pulse: 82 71 79   Resp: 18 18 16    Temp: (!) 97.3 F (36.3 C) 97.8 F (36.6 C) 97.7 F (36.5 C)   TempSrc: Oral Oral Oral   SpO2: 96% 94% 97% 97%    General: Pt is alert, awake, not in acute distress Cardiovascular: RRR, S1/S2 +, no rubs, no gallops Respiratory: CTA bilaterally, no wheezing, no rhonchi Abdominal: Soft, NT, ND, bowel sounds + Extremities: no edema, no cyanosis    The results of significant diagnostics from this hospitalization (including imaging, microbiology, ancillary and laboratory) are listed below for reference.     Microbiology: Recent Results (from the past 240 hour(s))  SARS CORONAVIRUS 2 (TAT 6-24 HRS) Nasopharyngeal Nasopharyngeal Swab     Status: None   Collection Time: 07/02/19 12:59 AM   Specimen: Nasopharyngeal Swab  Result Value Ref Range Status   SARS Coronavirus 2 NEGATIVE NEGATIVE Final    Comment: (NOTE) SARS-CoV-2 target nucleic acids are NOT DETECTED. The SARS-CoV-2 RNA is generally detectable in upper and lower respiratory specimens during the acute phase of infection. Negative results do not preclude SARS-CoV-2 infection, do not rule out co-infections with other pathogens, and should not be used as the sole basis for treatment or other patient management decisions. Negative results must be combined  with clinical observations, patient history, and epidemiological information. The expected result is Negative. Fact Sheet for Patients: 07/04/19 Fact Sheet for Healthcare Providers: HairSlick.no This test is not yet approved or cleared by the [place] FDA and  has been authorized for detection and/or diagnosis of SARS-CoV-2 by FDA under an Emergency Use Authorization (EUA). This EUA will remain  in effect (meaning this test can be used) for the duration of the COVID-19 declaration under Section 56 4(b)(1) of the Act, 21 U.S.C. section 360bbb-3(b)(1), unless the authorization is terminated or revoked sooner. Performed at [facility], [address], [place], [place]   Gastrointestinal Panel by PCR , Stool     Status: None   Collection Time: 07/03/19 12:50 PM   Specimen: Stool  Result Value Ref Range Status   Campylobacter species NOT DETECTED NOT DETECTED Final   Plesimonas shigelloides NOT DETECTED NOT DETECTED Final   Salmonella species NOT DETECTED NOT DETECTED Final   Yersinia enterocolitica NOT DETECTED NOT DETECTED Final   Vibrio species NOT DETECTED NOT DETECTED Final   Vibrio cholerae NOT DETECTED NOT DETECTED Final   Enteroaggregative E coli (EAEC) NOT DETECTED NOT  DETECTED Final   Enteropathogenic E coli (EPEC) NOT DETECTED NOT DETECTED Final   Enterotoxigenic E coli (ETEC) NOT DETECTED NOT DETECTED Final   Shiga like toxin producing E coli (STEC) NOT DETECTED NOT DETECTED Final   Shigella/Enteroinvasive E coli (EIEC) NOT DETECTED NOT DETECTED Final   Cryptosporidium NOT DETECTED NOT DETECTED Final   Cyclospora cayetanensis NOT DETECTED NOT DETECTED Final   Entamoeba histolytica NOT DETECTED NOT DETECTED Final   Giardia lamblia NOT DETECTED NOT DETECTED Final   Adenovirus F40/41 NOT DETECTED NOT DETECTED Final   Astrovirus NOT DETECTED NOT DETECTED Final   Norovirus GI/GII NOT DETECTED  NOT DETECTED Final   Rotavirus A NOT DETECTED NOT DETECTED Final   Sapovirus (I, II, IV, and V) NOT DETECTED NOT DETECTED Final    Comment: Performed at [facility], [place] [place], [place] [address]     Labs: BNP (last 3 results) No results for input(s): BNP in the last 8760 hours. Basic Metabolic Panel: Recent Labs  Lab 07/01/19 2017 07/02/19 0040 07/02/19 0436 07/03/19 0746 07/04/19 0412 07/05/19 0425  NA 133*  --  138 137 136 138  K 2.4*  --  3.3* 2.9* 3.7 3.9  CL 98  --  105 103 106 104  CO2 26  --  24 25 25 28   GLUCOSE 84  --  99 97 89 94  BUN <5*  --  <5* <5* 5* 6  CREATININE 0.39*  --  0.41* 0.42* 0.40* 0.50  CALCIUM 8.8*  --  8.4* 8.6* 8.4* 8.6*  MG 1.6*  --  1.5*  --  1.4* 1.6*  PHOS  --  2.6  --   --   --   --    Liver Function Tests: Recent Labs  Lab 07/01/19 2017 07/02/19 0436  AST 38 35  ALT 29 26  ALKPHOS 45 37*  BILITOT 1.0 1.1  PROT 8.7* 7.5  ALBUMIN 3.2* 2.7*   Recent Labs  Lab 07/01/19 2017  LIPASE 19   No results for input(s): AMMONIA in the last 168 hours. CBC: Recent Labs  Lab 07/01/19 2017 07/02/19 0436 07/03/19 0746 07/04/19 0412  WBC 5.8 5.6 4.0 4.5  HGB 12.7 11.3* 10.8* 10.4*  HCT 40.8 35.4* 34.1* 33.4*  MCV 97.1 96.2 96.9 96.3  PLT 291 272 242 213   Cardiac Enzymes: No results for input(s): CKTOTAL, CKMB, CKMBINDEX, TROPONINI in the last 168 hours. BNP: Invalid input(s): POCBNP CBG: No results for input(s): GLUCAP in the last 168 hours. D-Dimer No results for input(s): DDIMER in the last 72 hours. Hgb A1c No results for input(s): HGBA1C in the last 72 hours. Lipid Profile No results for input(s): CHOL, HDL, LDLCALC, TRIG, CHOLHDL, LDLDIRECT in the last 72 hours. Thyroid function studies No results for input(s): TSH, T4TOTAL, T3FREE, THYROIDAB in the last 72 hours.  Invalid input(s): FREET3 Anemia work up No results for input(s): VITAMINB12, FOLATE, FERRITIN, TIBC, IRON, RETICCTPCT in the last  72 hours. Urinalysis    Component Value Date/Time   COLORURINE AMBER (A) 07/01/2019 1510   APPEARANCEUR HAZY (A) 07/01/2019 1510   LABSPEC 1.027 07/01/2019 1510   PHURINE 5.0 07/01/2019 1510   GLUCOSEU NEGATIVE 07/01/2019 1510   HGBUR NEGATIVE 07/01/2019 1510   BILIRUBINUR NEGATIVE 07/01/2019 1510   KETONESUR 5 (A) 07/01/2019 1510   PROTEINUR 100 (A) 07/01/2019 1510   UROBILINOGEN 1.0 11/14/2014 2119   NITRITE NEGATIVE 07/01/2019 1510   LEUKOCYTESUR TRACE (A) 07/01/2019 1510   Sepsis Labs Invalid  input(s): PROCALCITONIN,  WBC,  LACTICIDVEN Microbiology Recent Results (from the past 240 hour(s))  SARS CORONAVIRUS 2 (TAT 6-24 HRS) Nasopharyngeal Nasopharyngeal Swab     Status: None   Collection Time: 07/02/19 12:59 AM   Specimen: Nasopharyngeal Swab  Result Value Ref Range Status   SARS Coronavirus 2 NEGATIVE NEGATIVE Final    Comment: (NOTE) SARS-CoV-2 target nucleic acids are NOT DETECTED. The SARS-CoV-2 RNA is generally detectable in upper and lower respiratory specimens during the acute phase of infection. Negative results do not preclude SARS-CoV-2 infection, do not rule out co-infections with other pathogens, and should not be used as the sole basis for treatment or other patient management decisions. Negative results must be combined with clinical observations, patient history, and epidemiological information. The expected result is Negative. Fact Sheet for Patients: HairSlick.no Fact Sheet for Healthcare Providers: quierodirigir.com This test is not yet approved or cleared by the [place] FDA and  has been authorized for detection and/or diagnosis of SARS-CoV-2 by FDA under an Emergency Use Authorization (EUA). This EUA will remain  in effect (meaning this test can be used) for the duration of the COVID-19 declaration under Section 56 4(b)(1) of the Act, 21 U.S.C. section 360bbb-3(b)(1), unless the  authorization is terminated or revoked sooner. Performed at [facility], [address], [place], [place] [address]   Gastrointestinal Panel by PCR , Stool     Status: None   Collection Time: 07/03/19 12:50 PM   Specimen: Stool  Result Value Ref Range Status   Campylobacter species NOT DETECTED NOT DETECTED Final   Plesimonas shigelloides NOT DETECTED NOT DETECTED Final   Salmonella species NOT DETECTED NOT DETECTED Final   Yersinia enterocolitica NOT DETECTED NOT DETECTED Final   Vibrio species NOT DETECTED NOT DETECTED Final   Vibrio cholerae NOT DETECTED NOT DETECTED Final   Enteroaggregative E coli (EAEC) NOT DETECTED NOT DETECTED Final   Enteropathogenic E coli (EPEC) NOT DETECTED NOT DETECTED Final   Enterotoxigenic E coli (ETEC) NOT DETECTED NOT DETECTED Final   Shiga like toxin producing E coli (STEC) NOT DETECTED NOT DETECTED Final   Shigella/Enteroinvasive E coli (EIEC) NOT DETECTED NOT DETECTED Final   Cryptosporidium NOT DETECTED NOT DETECTED Final   Cyclospora cayetanensis NOT DETECTED NOT DETECTED Final   Entamoeba histolytica NOT DETECTED NOT DETECTED Final   Giardia lamblia NOT DETECTED NOT DETECTED Final   Adenovirus F40/41 NOT DETECTED NOT DETECTED Final   Astrovirus NOT DETECTED NOT DETECTED Final   Norovirus GI/GII NOT DETECTED NOT DETECTED Final   Rotavirus A NOT DETECTED NOT DETECTED Final   Sapovirus (I, II, IV, and V) NOT DETECTED NOT DETECTED Final    Comment: Performed at [facility], [address] [place], [place] [address]     Time coordinating discharge: Over 30 minutes  SIGNED:   Hughie Closs, MD  [facility] 07/05/2019, 10:43 AM  If 7PM-7AM, please contact night-coverage [email or web address]

## 2019-07-05 NOTE — Discharge Instructions (Signed)

## 2019-07-13 ENCOUNTER — Ambulatory Visit (HOSPITAL_BASED_OUTPATIENT_CLINIC_OR_DEPARTMENT_OTHER): Payer: Self-pay | Admitting: Licensed Clinical Social Worker

## 2019-07-13 ENCOUNTER — Other Ambulatory Visit: Payer: Self-pay

## 2019-07-13 ENCOUNTER — Ambulatory Visit: Payer: Self-pay | Attending: Family Medicine | Admitting: Family Medicine

## 2019-07-13 ENCOUNTER — Encounter: Payer: Self-pay | Admitting: Family Medicine

## 2019-07-13 VITALS — BP 118/77 | HR 82 | Temp 98.1°F | Ht 67.0 in | Wt 209.6 lb

## 2019-07-13 DIAGNOSIS — Z09 Encounter for follow-up examination after completed treatment for conditions other than malignant neoplasm: Secondary | ICD-10-CM

## 2019-07-13 DIAGNOSIS — F331 Major depressive disorder, recurrent, moderate: Secondary | ICD-10-CM

## 2019-07-13 DIAGNOSIS — Z789 Other specified health status: Secondary | ICD-10-CM

## 2019-07-13 DIAGNOSIS — D649 Anemia, unspecified: Secondary | ICD-10-CM

## 2019-07-13 DIAGNOSIS — IMO0002 Reserved for concepts with insufficient information to code with codable children: Secondary | ICD-10-CM

## 2019-07-13 DIAGNOSIS — K529 Noninfective gastroenteritis and colitis, unspecified: Secondary | ICD-10-CM

## 2019-07-13 DIAGNOSIS — E876 Hypokalemia: Secondary | ICD-10-CM

## 2019-07-13 DIAGNOSIS — R918 Other nonspecific abnormal finding of lung field: Secondary | ICD-10-CM

## 2019-07-13 DIAGNOSIS — Z720 Tobacco use: Secondary | ICD-10-CM

## 2019-07-13 DIAGNOSIS — R739 Hyperglycemia, unspecified: Secondary | ICD-10-CM

## 2019-07-13 DIAGNOSIS — M329 Systemic lupus erythematosus, unspecified: Secondary | ICD-10-CM

## 2019-07-13 DIAGNOSIS — K219 Gastro-esophageal reflux disease without esophagitis: Secondary | ICD-10-CM

## 2019-07-13 MED ORDER — FAMOTIDINE 20 MG PO TABS: 20.0000 mg | ORAL_TABLET | Freq: Two times a day (BID) | ORAL | 3 refills | Status: DC

## 2019-07-13 MED FILL — FAMOTIDINE 20 MG TABS: 20 | 30 days supply | Qty: 60 | Fill #0

## 2019-07-13 NOTE — Patient Instructions (Signed)
Living With Depression Everyone experiences occasional disappointment, sadness, and loss in their lives. When you are feeling down, blue, or sad for at least 2 weeks in a row, it may mean that you have depression. Depression can affect your thoughts and feelings, relationships, daily activities, and physical health. It is caused by changes in the way your brain functions. If you receive a diagnosis of depression, your health care provider will tell you which type of depression you have and what treatment options are available to you. If you are living with depression, there are ways to help you recover from it and also ways to prevent it from coming back. How to cope with lifestyle changes Coping with stress     Stress is your body's reaction to life changes and events, both good and bad. Stressful situations may include:  Getting married.  The death of a spouse.  Losing a job.  Retiring.  Having a baby. Stress can last just a few hours or it can be ongoing. Stress can play a major role in depression, so it is important to learn both how to cope with stress and how to think about it differently. Talk with your health care provider or a counselor if you would like to learn more about stress reduction. He or she may suggest some stress reduction techniques, such as:  Music therapy. This can include creating music or listening to music. Choose music that you enjoy and that inspires you.  Mindfulness-based meditation. This kind of meditation can be done while sitting or walking. It involves being aware of your normal breaths, rather than trying to control your breathing.  Centering prayer. This is a kind of meditation that involves focusing on a spiritual word or phrase. Choose a word, phrase, or sacred image that is meaningful to you and that brings you peace.  Deep breathing. To do this, expand your stomach and inhale slowly through your nose. Hold your breath for 3-5 seconds, then exhale  slowly, allowing your stomach muscles to relax.  Muscle relaxation. This involves intentionally tensing muscles then relaxing them. Choose a stress reduction technique that fits your lifestyle and personality. Stress reduction techniques take time and practice to develop. Set aside 5-15 minutes a day to do them. Therapists can offer training in these techniques. The training may be covered by some insurance plans. Other things you can do to manage stress include:  Keeping a stress diary. This can help you learn what triggers your stress and ways to control your response.  Understanding what your limits are and saying no to requests or events that lead to a schedule that is too full.  Thinking about how you respond to certain situations. You may not be able to control everything, but you can control how you react.  Adding humor to your life by watching funny films or TV shows.  Making time for activities that help you relax and not feeling guilty about spending your time this way.  Medicines Your health care provider may suggest certain medicines if he or she feels that they will help improve your condition. Avoid using alcohol and other substances that may prevent your medicines from working properly (may interact). It is also important to:  Talk with your pharmacist or health care provider about all the medicines that you take, their possible side effects, and what medicines are safe to take together.  Make it your goal to take part in all treatment decisions (shared decision-making). This includes giving input on   the side effects of medicines. It is best if shared decision-making with your health care provider is part of your total treatment plan. If your health care provider prescribes a medicine, you may not notice the full benefits of it for 4-8 weeks. Most people who are treated for depression need to be on medicine for at least 6-12 months after they feel better. If you are taking  medicines as part of your treatment, do not stop taking medicines without first talking to your health care provider. You may need to have the medicine slowly decreased (tapered) over time to decrease the risk of harmful side effects. Relationships Your health care provider may suggest family therapy along with individual therapy and drug therapy. While there may not be family problems that are causing you to feel depressed, it is still important to make sure your family learns as much as they can about your mental health. Having your family's support can help make your treatment successful. How to recognize changes in your condition Everyone has a different response to treatment for depression. Recovery from major depression happens when you have not had signs of major depression for two months. This may mean that you will start to:  Have more interest in doing activities.  Feel less hopeless than you did 2 months ago.  Have more energy.  Overeat less often, or have better or improving appetite.  Have better concentration. Your health care provider will work with you to decide the next steps in your recovery. It is also important to recognize when your condition is getting worse. Watch for these signs:  Having fatigue or low energy.  Eating too much or too little.  Sleeping too much or too little.  Feeling restless, agitated, or hopeless.  Having trouble concentrating or making decisions.  Having unexplained physical complaints.  Feeling irritable, angry, or aggressive. Get help as soon as you or your family members notice these symptoms coming back. How to get support and help from others How to talk with friends and family members about your condition  Talking to friends and family members about your condition can provide you with one way to get support and guidance. Reach out to trusted friends or family members, explain your symptoms to them, and let them know that you are  working with a health care provider to treat your depression. Financial resources Not all insurance plans cover mental health care, so it is important to check with your insurance carrier. If paying for co-pays or counseling services is a problem, search for a local or county mental health care center. They may be able to offer public mental health care services at low or no cost when you are not able to see a private health care provider. If you are taking medicine for depression, you may be able to get the generic form, which may be less expensive. Some makers of prescription medicines also offer help to patients who cannot afford the medicines they need. Follow these instructions at home:   Get the right amount and quality of sleep.  Cut down on using caffeine, tobacco, alcohol, and other potentially harmful substances.  Try to exercise, such as walking or lifting small weights.  Take over-the-counter and prescription medicines only as told by your health care provider.  Eat a healthy diet that includes plenty of vegetables, fruits, whole grains, low-fat dairy products, and lean protein. Do not eat a lot of foods that are high in solid fats, added sugars, or salt.    Keep all follow-up visits as told by your health care provider. This is important. Contact a health care provider if:  You stop taking your antidepressant medicines, and you have any of these symptoms: ? Nausea. ? Headache. ? Feeling lightheaded. ? Chills and body aches. ? Not being able to sleep (insomnia).  You or your friends and family think your depression is getting worse. Get help right away if:  You have thoughts of hurting yourself or others. If you ever feel like you may hurt yourself or others, or have thoughts about taking your own life, get help right away. You can go to your nearest emergency department or call:  Your local emergency services (911 in the [place]  A suicide crisis helpline, such as the  National Suicide Prevention Lifeline at 1-800-273-8255. This is open 24-hours a day. Summary  If you are living with depression, there are ways to help you recover from it and also ways to prevent it from coming back.  Work with your health care team to create a management plan that includes counseling, stress management techniques, and healthy lifestyle habits. This information is not intended to replace advice given to you by your health care provider. Make sure you discuss any questions you have with your health care provider. Document Revised: 06/18/2018 Document Reviewed: 01/28/2016 Elsevier Patient Education  2020 Elsevier Inc.  

## 2019-07-13 NOTE — Progress Notes (Signed)
HFU  Need to see social worker  Shoulder pain for more then 2 years can not lift it  irregular period  Lupus   Slump when walking Gets tired when walk for long period at a time or even standing up  Mother passed in July  36.7  209.6

## 2019-07-13 NOTE — Progress Notes (Signed)
Subjective:  Patient ID: Lisa Foley, female    DOB: September 03, 1970  Age: 49 y.o. MRN: [id number]  CC: Establish care/hospital follow-up-Nickolus Wadding MD  HPI Lisa Foley, 49 yo female new to the practice, who is status post hospital discharge for nausea and vomiting with hypokalemia and low magnesium as well as MDD- major depressive disorder with grief. She also has Lupus and normocytic anemia on CBC during hospitalization.         Patient reports that she feels better at today's visit status post hospitalization. Diarrhea has improved but she reports that this has been a chronic issue.. She is now having formed stools but still soft. She denies any current abdominal pain. When she was having increased diarrhea, she did notice an episode x1 of blood in stool and blood with wiping after a bowel movement. No black stool. She continues to have some occasional nausea. She also reports some throat irritation but no difficulty swallowing. She has had some issues with backwash of bad tasting fluid into the mouth and throat. No chest pain or palpitations. She does have some shortness of breath with exertion which she thinks is related to her lupus. She does however continue to smoke.          Due to her lack of insurance, she has not had any recent follow-up with rheumatology regarding her lupus. She does have chronic issues with muscle pain pain and joint pain/swelling. She also has rash/skin changes associated with lupus. She feels that her depression status post hospitalization is slightly improved. She believes that some of her depression and anxiety is related to her chronic health conditions as well as the death of her mother last Sep 10, 2022. Patient denies any active suicidal thoughts, ideations or plans. She would be willing to see a counselor/psychiatrist in follow-up of her grief and depression.  Past Medical History:  Diagnosis Date  . Acute bronchitis due to COVID-19 virus 09/05/2018  .  Carpal tunnel syndrome   . Gastroenteritis due to COVID-19 virus 09/05/2018  . Lupus [facility])     Past Surgical History:  Procedure Laterality Date  . TOTAL HIP ARTHROPLASTY    . TUBAL LIGATION      Family History  Problem Relation Age of Onset  . Cancer Father   . High blood pressure Mother   . High blood pressure Brother   . High blood pressure Brother   . Lupus Sister   . Lupus Sister     Social History   Tobacco Use  . Smoking status: Current Every Day Smoker    Last attempt to quit: 04/28/2015    Years since quitting: 4.2  . Smokeless tobacco: Never Used  Substance Use Topics  . Alcohol use: Yes    Alcohol/week: 0.0 standard drinks    Comment: occasional    ROS Review of Systems  Constitutional: Positive for fatigue. Negative for chills and fever.  HENT: Positive for sore throat (throat irritation-possible reflux). Negative for trouble swallowing.   Respiratory: Positive for shortness of breath (occasional). Negative for cough.   Cardiovascular: Negative for chest pain, palpitations and leg swelling.       Upper substernal chest discomfort/reflux symptoms  Gastrointestinal: Positive for blood in stool, diarrhea and nausea (Chronic but improved). Negative for abdominal pain and constipation.       Bright red blood when wiping after a bowel movement  Endocrine: Negative for polydipsia, polyphagia and polyuria.  Genitourinary: Negative for dysuria and frequency.  Musculoskeletal: Positive for arthralgias, back  pain, joint swelling and myalgias.       Pain in back, hands-also has CTS, hips, feet and right ankle  Skin: Positive for color change and rash. Negative for wound.       Skin changes possibly due to lupus on chest, arms, torso  Neurological: Negative for dizziness and headaches.  Hematological: Negative for adenopathy. Does not bruise/bleed easily.  Psychiatric/Behavioral: Positive for decreased concentration. Negative for self-injury and suicidal ideas. The  patient is nervous/anxious.     Objective:   Today's Vitals: BP 118/77   Pulse 82   Temp 98.1 F (36.7 C) (Temporal)   Ht 5\' 7"  (1.702 m)   Wt 209 lb 9.6 oz (95.1 kg)   LMP 07/12/2017 (Approximate)   SpO2 97%   BMI 32.83 kg/m   Physical Exam Vitals and nursing note reviewed.  Constitutional:      General: She is not in acute distress.    Appearance: Normal appearance. She is obese.  Cardiovascular:     Rate and Rhythm: Normal rate and regular rhythm.  Pulmonary:     Effort: Pulmonary effort is normal.     Breath sounds: Normal breath sounds.     Comments: Mild decreased air movement/decreased breath sounds especially at lung bases. No increased work of breathing. Abdominal:     Palpations: Abdomen is soft.     Tenderness: There is no abdominal tenderness. There is no right CVA tenderness, left CVA tenderness, guarding or rebound.  Musculoskeletal:        General: Tenderness present.     Cervical back: Normal range of motion and neck supple.     Right lower leg: No edema.     Left lower leg: No edema.     Comments: Lumbosacral tenderness, positive Tinel at the wrists  Skin:    General: Skin is warm and dry.     Findings: Rash (Areas of hyperpigmented skin) present.  Neurological:     General: No focal deficit present.     Mental Status: She is alert and oriented to person, place, and time.  Psychiatric:        Mood and Affect: Mood normal.        Behavior: Behavior normal.     Comments: Slightly flattened affect but interacts appropriately     Assessment & Plan:   1. Hospital discharge follow-up; 2. Lupus; 3. Normocytic anemia; 4. Abnormal finding on imaging of the lung; 5. Hypokalemia; 6. Moderate episode of major depressive disorder; 7. Chronic diarrhea; 8. GERD; 9. Tobacco use Hospital records regarding patient's recent admission from 07/01/2019 through 07/05/2019 at [facility] we reviewed and discussed with the patient at today's visit. Patient has had  chronic issues with nausea and diarrhea and had acute worsening prior to presentation to the emergency department at which time her potassium was found to be 2.4, sodium of 133 and magnesium of 1.6 with normal CBC and lipase. She additionally had some increase in pulmonary opacities which were thought to possibly represent worsening of interstitial lung disease or pneumonia and she was initially started on antibiotic therapy but as she remained asymptomatic, antibiotic therapy was discontinued. Magnesium during hospitalization ranged from 1.4-1.6 and she was provided with magnesium replacement therapy with magnesium of 1.6 on day of discharge 07/05/2019. Potassium replacement was also provided and patient with improvement of potassium level from 2.4 on 07/01/19 to normal range of 3.9 on 07/05/19. Per discharge summary, basic metabolic panel and CBC recommended at follow-up which will be done  in follow-up of hypokalemia and anemia. CBC was initially normal, perhaps secondary to dehydration but on 07/03/19 and 07/04/19 hemoglobin was 10.8 and 10.4 respectively.  Patient also expressed during hospitalization that she was having issues with depression related to grief after her mother passed away last August 26, 2022 and per discharge summary, treatment of depression was to be done by PCP once patient established care. Depression and grief prolonged reaction/bereavement discussed with the patient and patient agrees to referral to psychiatry for further evaluation and treatment. Psychiatry referral placed and patient is aware that she should contact the office if she has not heard anything regarding her appointment within the next 1 to 2 weeks. Also discussed referral to medical social worker and patient agrees to the referral to help with further resources regarding counseling and other concerns while awaiting psychiatry appointment. Patient has also made aware of the [facility] financial discount program to help with ongoing medical cost  and she is encouraged to make appointment with financial counselor at today's checkout. Chest x-ray will be repeated in follow-up of findings suggestive of interstitial lung disease during hospitalization and patient with lupus. She will be referred to establish care with rheumatologist for ongoing care of her lupus. Patient was offered referral to the clinical social worker to help with smoking cessation which she declines at this time as she is not yet ready to stop smoking. Patient's hospitalization was secondary to acute on chronic diarrhea and nausea. She reports improvement in diarrhea and does not wish to have gastroenterology referral at this time. Patient with some continued mild nausea as well as acid reflux symptoms and prescription provided for Pepcid 20 mg twice daily. She should also avoid known trigger foods, nonsteroidal anti-inflammatory medications and avoidance of eating within 2 hours of bedtime. - CBC - Basic Metabolic Panel -Ambulatory referral to psychiatry  2. Hyperglycemia She reports that she has had prior issues with elevated blood sugars and would like to have recheck of her blood sugar at today's visit. Glucose will be checked as part of her basic metabolic panel. - Basic Metabolic Panel    Outpatient Encounter Medications as of 07/13/2019  Medication Sig  . acetaminophen (TYLENOL) 500 MG tablet Take 2 tablets (1,000 mg total) by mouth every 8 (eight) hours as needed for mild pain. Do NOT exceed 4,000mg  in 24 hours (Patient taking differently: Take 2,000 mg by mouth as needed for moderate pain. Do NOT exceed 4,000mg  in 24 hours)  . budesonide-formoterol (SYMBICORT) 160-4.5 MCG/ACT inhaler Inhale 2 puffs into the lungs 2 (two) times daily.   No facility-administered encounter medications on file as of 07/13/2019.    An After Visit Summary was printed and given to the patient.   Follow-up: Return in about 3 weeks (around 08/03/2019) for lupus and chronic medical  issues-sooner if needed; ED if any acute worsening of symptoms.  Greater than 45 minutes was spent with the patient in face-to-face time at today's encounter including obtaining her past and current medical issues, discussing recent hospitalization records with the patient, obtaining review of systems, performing physical examination, creating assessment and treatment plan that was then discussed with and agreed upon by the patient and all questions were answered to patient's satisfaction. An additional 20 minutes was spent on pre and post appointment review of medical records/hospital records including labs/imaging as well as creation of today's visit note.  Cain Saupe MD

## 2019-07-14 LAB — BASIC METABOLIC PANEL WITH GFR
BUN/Creatinine Ratio: 15 (ref 9–23)
BUN: 7 mg/dL (ref 6–24)
CO2: 24 mmol/L (ref 20–29)
Calcium: 9.3 mg/dL (ref 8.7–10.2)
Chloride: 99 mmol/L (ref 96–106)
Creatinine, Ser: 0.48 mg/dL — ABNORMAL LOW (ref 0.57–1.00)
GFR calc Af Amer: 134 mL/min/1.73
GFR calc non Af Amer: 116 mL/min/1.73
Glucose: 121 mg/dL — ABNORMAL HIGH (ref 65–99)
Potassium: 3.7 mmol/L (ref 3.5–5.2)
Sodium: 135 mmol/L (ref 134–144)

## 2019-07-14 LAB — CBC
Hematocrit: 37.8 % (ref 34.0–46.6)
Hemoglobin: 12.2 g/dL (ref 11.1–15.9)
MCH: 30 pg (ref 26.6–33.0)
MCHC: 32.3 g/dL (ref 31.5–35.7)
MCV: 93 fL (ref 79–97)
Platelets: 288 x10E3/uL (ref 150–450)
RBC: 4.06 x10E6/uL (ref 3.77–5.28)
RDW: 13.8 % (ref 11.7–15.4)
WBC: 5.7 x10E3/uL (ref 3.4–10.8)

## 2019-07-14 LAB — HEPATIC FUNCTION PANEL
ALT: 34 IU/L — ABNORMAL HIGH (ref 0–32)
AST: 44 IU/L — ABNORMAL HIGH (ref 0–40)
Albumin: 3.9 g/dL (ref 3.8–4.8)
Alkaline Phosphatase: 61 IU/L (ref 39–117)
Bilirubin Total: 0.5 mg/dL (ref 0.0–1.2)
Bilirubin, Direct: 0.12 mg/dL (ref 0.00–0.40)
Total Protein: 9.2 g/dL — ABNORMAL HIGH (ref 6.0–8.5)

## 2019-07-15 ENCOUNTER — Ambulatory Visit
Admission: RE | Admit: 2019-07-15 | Discharge: 2019-07-15 | Disposition: A | Discharge status: Home / Self Care | Payer: Self-pay | Source: Ambulatory Visit | Attending: Family Medicine | Admitting: Family Medicine

## 2019-07-15 DIAGNOSIS — M329 Systemic lupus erythematosus, unspecified: Secondary | ICD-10-CM

## 2019-07-15 DIAGNOSIS — R918 Other nonspecific abnormal finding of lung field: Secondary | ICD-10-CM

## 2019-08-03 ENCOUNTER — Telehealth: Payer: Self-pay | Admitting: Family Medicine

## 2019-08-08 NOTE — BH Specialist Note (Signed)
Integrated Behavioral Health Initial Visit  MRN: [id number] Name: Lisa Foley  Number of Integrated Behavioral Health Clinician visits:: 1/6 Session Start time: 11:20 AM  Session End time: 11:50 AM Total time: 30  Type of Service: Integrated Behavioral Health- Individual Interpretor:No. Interpretor Name and Language: NA   Warm Hand Off Completed.       SUBJECTIVE: Lisa Foley is a 49 y.o. female accompanied by self Patient was referred by Dr. Jillyn Hidden for depression and anxiety. Patient reports the following symptoms/concerns: Pt reports increase in depression and anxiety symptoms triggered by psychosocial stressors and grief of mother Duration of problem: July 2020; Severity of problem: severe  OBJECTIVE: Mood: Depressed and Affect: Depressed Risk of harm to self or others: No plan to harm self or others  LIFE CONTEXT: Family and Social: Pt receives support from sister in [place]. School/Work: Pt is employed; however, reports difficulty due to chronic pain Self-Care: Pt reports difficulty sleeping and fluctuating appetite  Life Changes: Pt reports food insecurity and is concerned about possible eviction from home due to inability to pay rent  GOALS ADDRESSED: Patient will: 1. Reduce symptoms of: anxiety, depression and stress 2. Increase knowledge and/or ability of: coping skills and healthy habits  3. Demonstrate ability to: Increase healthy adjustment to current life circumstances, Increase adequate support systems for patient/family and Begin healthy grieving over loss  INTERVENTIONS: Interventions utilized: Solution-Focused Strategies, Supportive Counseling, Psychoeducation and/or Health Education and Link to [employer]  Standardized Assessments completed: GAD-7 and PHQ 2&9  ASSESSMENT: Patient currently experiencing increase in anxiety and depression symptoms triggered by grief and psychosocial stressors.   Patient may benefit from linkage to  community resources and psychotherapy. Strategies to assist in decreasing symptoms discussed. LCSW provided resources to assist with food insecurity and possible eviction. Pt provided verbal consent for LCSW to complete Legal Aid referral.   PLAN: 1. Follow up with behavioral health clinician on : Contact LCSW with any additional behavioral health and/or resource needs 2. Behavioral recommendations: Utilize strategies and resources provided 3. Referral(s): Integrated Art gallery manager (In Clinic) and [employer]:  Food, Actuary and Housing 4. "From scale of 1-10, how likely are you to follow plan?":   Bridgett Larsson, LCSW 08/08/2019 8:04 AM

## 2019-10-03 ENCOUNTER — Ambulatory Visit: Payer: Self-pay

## 2019-10-03 NOTE — Telephone Encounter (Signed)
Returned call to patient. She states that she has Lupus and needs to know if she can have the COVID-19 vaccine.  She was advised to call her rheumatologist for this advice.  She verbalized understanding.  Reason for Disposition . Health Information question, no triage required and triager able to answer question    Patient will call her rheumatologist for her question  Answer Assessment - Initial Assessment Questions 1. REASON FOR CALL or QUESTION: "What is your reason for calling today?" or "How can I best help you?" or "What question do you have that I can help answer?"     I have lupus Can I get the covid-19 vaccine  Protocols used: INFORMATION ONLY CALL - NO TRIAGE-A-AH

## 2019-10-07 ENCOUNTER — Other Ambulatory Visit: Payer: Self-pay | Admitting: Family Medicine

## 2019-10-07 DIAGNOSIS — K219 Gastro-esophageal reflux disease without esophagitis: Secondary | ICD-10-CM

## 2019-10-20 ENCOUNTER — Other Ambulatory Visit: Payer: Self-pay

## 2019-10-20 ENCOUNTER — Ambulatory Visit: Payer: Self-pay | Attending: Family Medicine | Admitting: Family

## 2019-10-20 DIAGNOSIS — IMO0002 Reserved for concepts with insufficient information to code with codable children: Secondary | ICD-10-CM

## 2019-10-20 DIAGNOSIS — K529 Noninfective gastroenteritis and colitis, unspecified: Secondary | ICD-10-CM

## 2019-10-20 DIAGNOSIS — D649 Anemia, unspecified: Secondary | ICD-10-CM

## 2019-10-20 DIAGNOSIS — E876 Hypokalemia: Secondary | ICD-10-CM

## 2019-10-20 DIAGNOSIS — Z720 Tobacco use: Secondary | ICD-10-CM

## 2019-10-20 DIAGNOSIS — R918 Other nonspecific abnormal finding of lung field: Secondary | ICD-10-CM

## 2019-10-20 DIAGNOSIS — F331 Major depressive disorder, recurrent, moderate: Secondary | ICD-10-CM

## 2019-10-20 DIAGNOSIS — K219 Gastro-esophageal reflux disease without esophagitis: Secondary | ICD-10-CM

## 2019-10-20 DIAGNOSIS — M329 Systemic lupus erythematosus, unspecified: Secondary | ICD-10-CM

## 2019-10-20 NOTE — Patient Instructions (Addendum)
Referral to rheumatology for lupus pending approval of insurance. Referral to social work for depression. Follow-up with primary physician as needed.  Systemic Lupus Erythematosus, Adult Systemic lupus erythematosus (SLE) is a long-term (chronic) disease that can affect many parts of the body. SLE is an autoimmune disease. With this type of disease, the body's defense system (immune system) mistakenly attacks healthy tissues. This can cause damage to the skin, joints, blood vessels, brain, kidneys, lungs, heart, and other internal organs. It causes pain, irritation, and inflammation. What are the causes? The cause of this condition is not known. What increases the risk? The following factors may make you more likely to develop this condition:  Being female.  Being of Asian, Hispanic, or African-American descent.  Having a family history of the condition.  Being exposed to tobacco smoke or smoking cigarettes.  Having an infection with a virus, such as Epstein-Barr virus.  Having a history of exposure to silica dust, metals, chemicals, mold or mildew, or insecticides.  Using oral contraceptives or hormone replacement therapy. What are the signs or symptoms? This condition can affect almost any organ or system in the body. Symptoms of the condition depend on which organ or system is affected. The most common symptoms include:  Fever.  Fatigue.  Weight loss.  Muscle aches.  Joint pain.  Skin rashes, especially over the nose and cheeks (butterfly rash) and after sun exposure. Symptoms can come and go. A period of time when symptoms get worse or come back is called a flare. A period of time with no symptoms is called a remission. How is this diagnosed? This condition is diagnosed based on:  Your symptoms.  Your medical history.  A physical exam. You may also have tests, including:  Blood tests.  Urine tests.  A chest X-ray. You may be referred to an autoimmune disease  specialist (rheumatologist). How is this treated? There is no cure for this condition, but treatment can help to control symptoms, prevent flares (keep symptoms in remission), and prevent damage to the heart, lungs, kidneys, and other organs. Treatment will depend on what symptoms you are having and what organs or systems are affected. Treatment may involve taking a combination of medicines over time. Common medicines used to treat this condition include:  Antimalarial medicines to control symptoms, prevent flares, and protect against organ damage.  Corticosteroids and NSAIDs to reduce inflammation.  Medicines to weaken your immune system (immunosuppressants).  Biologic response modifiers to reduce inflammation and damage. Follow these instructions at home: Eating and drinking  Eat a heart-healthy diet. This may include: ? Eating high-fiber foods, such as fresh fruits and vegetables, whole grains, and beans. ? Eating heart-healthy fats (omega-3 fats), such as fish, flaxseed, and flaxseed oil. ? Limiting foods that are high in saturated fat and cholesterol, such as processed and fried foods, fatty meat, and full-fat dairy. ? Limiting how much salt (sodium) you eat.  Include calcium and vitamin D in your diet. Good sources of calcium and vitamin D include: ? Low-fat dairy products such as milk, yogurt, and cheese. ? Certain fish, such as fresh or canned salmon, tuna, and sardines. ? Products that have calcium and vitamin D added to them (fortified products), such as fortified cereals or juice. Medicines  Take over-the-counter and prescription medicines only as told by your health care provider.  Do not take any medicines that contain estrogen without first checking with your health care provider. Estrogen can trigger flares and may increase your risk for blood  clots. Lifestyle      Stay active, as directed by your health care provider.  Do not use any products that contain nicotine  or tobacco, such as cigarettes and e-cigarettes. If you need help quitting, ask your health care provider.  Protect your skin from the sun by applying sunblock and wearing protective hats and clothing.  Learn as much as you can about your condition and have a good support system in place. Support may come from family, friends, or a lupus support group. General instructions  Work closely with all of your health care providers to manage your condition.  Stay up to date on all vaccines as directed by your health care provider.  Keep all follow-up visits as told by your health care provider. This is important. Contact a health care provider if:  You have a fever.  Your symptoms flare.  You develop new symptoms.  You have bloody, foamy, or coffee-colored urine.  There are changes in your urination. For example, you urinate more often at night.  You think that you may be depressed or have anxiety.  You become pregnant or plan to become pregnant. Pregnancy in women with this condition is considered high risk. Get help right away if:  You have chest pain.  You have trouble breathing.  You have a seizure.  You suddenly get a very bad headache.  You suddenly develop facial or body weakness.  You cannot speak.  You cannot understand speech. These symptoms may represent a serious problem that is an emergency. Do not wait to see if the symptoms will go away. Get medical help right away. Call your local emergency services (911 in the [place] Do not drive yourself to the hospital. Summary  Systemic lupus erythematosus (SLE) is a long-term disease that can affect many parts of the body.  SLE is an autoimmune disease. That means your body's defense system (immune system) mistakenly attacks healthy tissues.  There is no cure for this condition, but treatment can help to control symptoms, prevent flares, and prevent damage to your organs. Treatment may involve taking a combination of  medicines over time. This information is not intended to replace advice given to you by your health care provider. Make sure you discuss any questions you have with your health care provider. Document Revised: 04/09/2017 Document Reviewed: 04/03/2017 Elsevier Patient Education  2020 [employer].

## 2019-10-20 NOTE — Progress Notes (Signed)
Virtual Visit via Telephone Note  I connected with Lisa Foley, on 10/20/2019 at 3:03 PM by telephone due to the COVID-19 pandemic and verified that I am speaking with the correct person using two identifiers.  Due to current restrictions/limitations of in-office visits due to the COVID-19 pandemic, this scheduled clinical appointment was converted to a telehealth visit.   Consent: I discussed the limitations, risks, security and privacy concerns of performing an evaluation and management service by telephone and the availability of in person appointments. I also discussed with the patient that there may be a patient responsible charge related to this service. The patient expressed understanding and agreed to proceed.  Location of Patient: Home  Location of Provider: [facility]  Persons participating in Telemedicine visit: Lisa Foley Lisa Foley Lisa Kief, NP Lisa Foley, CMA  History of Present Illness: Lisa Foley is a 49 year old female with history of lupus, carpal tunnel syndrome, vitamin D deficiency, and major depressive disorder who presents for lupus follow-up.   1. LUPUS FOLLOW-UP: Last visit 07/13/2019 with Dr. Jillyn Hidden. During that encounter patient referred to establish care with rheumatologist for ongoing care of lupus.  Today patient reports she never received appointment for rheumatology. Reports still having flares, generalized itching, and throat irritation. Reports lupus flares frequently on chest becomes thickened and swells up and frequent flares on back. Reports she feels lupus is getting worse since last visit. Reports she is trying to get approved for Medicaid and disability so she can see rheumatology. Reports she has appointment next week for Medicaid and disability.   2. ANEMIA FOLLOW-UP: Last visit 07/13/2019 with Dr. Jillyn Hidden. During that encounter anemia improved.   3. ABNORMAL IMAGING ON LUNG FOLLOW-UP: Last visit  07/13/2019 with Dr. Jillyn Hidden. During that encounter repeat chest x-ray showed improvement in lung opacities. Today patient reports she is feeling well.   4. HYPOKALEMIA FOLLOW-UP:  Last visit 07/13/2019 with Dr. Jillyn Hidden. During that encounter repeat BMP resulted potassium normal.   6. DEPRESSION FOLLOW-UP: Last visit 07/13/2019 with Dr. Jillyn Hidden. During that encounter patient with depression related to grief after her mother passed away Sep 04, 2018. Depression and grief prolonged reaction/bereavement discussed with patient. Referred to psychiatry for further evaluation and management. Also referred to medical social worker for further resources regarding counseling while awaiting psychiatry appointment.   Today reports she has not been to psychiatry for financial reasons. Reports she has not spoken with social worker yet. Reports she is still feeling depressed and her mood fluctuates. Denies thought of self-harm and suicidal ideation.   7. CHRONIC DIARRHEA FOLLOW-UP: Last visit 07/13/2019 with Dr. Jillyn Hidden. During that encounter diarrhea improved. Denied referral to gastroenterology.  Today reports no longer having diarrhea. Now has formed stools and denies any blood in stool.   8. GERD FOLLOW-UP: Last visit 07/13/2019 with Dr. Jillyn Hidden. During that encounter prescribed Pepcid.   Today reports GERD controlled and Pepcid helping.   9. TOBACCO USE FOLLOW-UP: Last visit 07/13/2019 with Dr. Jillyn Hidden. During that encounter smoking cessation offered and patient declined.  Today patient reports still smoking half pack to 1 pack daily. States she is not ready to quit smoking.    Past Medical History:  Diagnosis Date  . Acute bronchitis due to COVID-19 virus 09/05/2018  . Carpal tunnel syndrome   . Gastroenteritis due to COVID-19 virus 09/05/2018  . Lupus (HCC)    Allergies  Allergen Reactions  . Advair Hfa [Fluticasone-Salmeterol] Swelling  . Aleve [Naproxen Sodium] Swelling    Current Outpatient Medications on  File Prior to  Visit  Medication Sig Dispense Refill  . acetaminophen (TYLENOL) 500 MG tablet Take 2 tablets (1,000 mg total) by mouth every 8 (eight) hours as needed for mild pain. Do NOT exceed 4,000mg  in 24 hours (Patient taking differently: Take 2,000 mg by mouth as needed for moderate pain. Do NOT exceed 4,000mg  in 24 hours) 30 tablet 0  . budesonide-formoterol (SYMBICORT) 160-4.5 MCG/ACT inhaler Inhale 2 puffs into the lungs 2 (two) times daily. 1 Inhaler   . famotidine (PEPCID) 20 MG tablet TAKE 1 TABLET BY MOUTH TWICE A DAY 60 tablet 0   No current facility-administered medications on file prior to visit.    Observations/Objective: Alert and oriented x 3. Not in acute distress. Physical examination not completed as this is a telemedicine visit.  Assessment and Plan: 1. Lupus (HCC): - Last visit 07/13/2019. During that encounter patient referred to establish care with rheumatologist for ongoing care of lupus. - Today patient reports she never received appointment for rheumatology. Reports still having flares, generalized itching, and throat irritation without difficulty breathing. Reports lupus flares frequently on chest becomes thickened and swells up and frequent flares on back. Reports she feels lupus is getting worse since last visit. Reports she is trying to get approved for Medicaid and disability so she can see rheumatology. Reports she has appointment next week for Medicaid and disability.  - Referral to rheumatology pending approval of Medicaid and/or [place] financial discount/orange card.  - Patient was given clear instructions to go to Emergency Department or return to medical center if symptoms don't improve, worsen, or new problems develop.The patient verbalized understanding. - Follow-up with primary physician as needed.   2. Normocytic anemia: - No history of taking iron supplements for anemia.  - Last CBC obtained 07/13/2019 resulted normal.  - Follow-up with primary physician as  needed.   3. Abnormality of lung on chest x-ray: - Last visit 07/13/2019. During that encounter repeat chest x-ray showed improvement in lung opacities. Today patient reports she is feeling well. Denies difficulty breathing and shortness of breath occasionally with exertion.  4. Moderate episode of recurrent major depressive disorder (HCC): - Last visit 07/13/2019. During that encounter patient with depression related to grief after her mother passed away 09-05-2018. Depression and grief prolonged reaction/bereavement discussed with patient. Referred to psychiatry for further evaluation and management. Also referred to medical social worker for further resources regarding counseling while awaiting psychiatry appointment.  - Today reports she has not been to psychiatry for financial reasons. Reports she has not spoken with social worker yet. Reports she is still feeling depressed and her mood fluctuates. Denies thought of self-harm and suicidal ideation.  - Referral to Social Work for counseling and community resources.  - Ambulatory referral to Social Work  5. Hypokalemia: - Patient not previously prescribed potassium supplements.  - Potassium last obtained 07/13/2019 resulted normal. - Follow-up with primary physician as needed.   6. Chronic diarrhea: - Last visit 07/13/2019. During that encounter diarrhea improved. Denied referral to gastroenterology. - Today reports no longer having diarrhea. Now has formed stools and denies any blood in stool.  - Follow-up with primary physician as needed.   7. Gastroesophageal reflux disease, unspecified whether esophagitis present: - Controlled. - Continue Pepcid 20 mg twice daily as prescribed. Refills available on file.  - Follow-up with primary physician as needed.  - You may feel better if you:  ?Lose weight (if you are overweight)  ?Raise the head of your bed  by 6 to 8 inches - You can do this by putting blocks of wood or rubber under 2 legs of the bed  or a foam wedge under the mattress.  ?Avoid foods that make your symptoms worse - For some people these include coffee, chocolate, alcohol, peppermint, and fatty foods.  ?Stop smoking, if you smoke  ?Avoid late meals - Lying down with a full stomach can make reflux worse. Try to plan meals for at least 2 to 3 hours before bedtime.  ?Avoid tight clothing - Some people feel better if they wear comfortable clothing that does not squeeze the stomach area.  8. Tobacco use: 9. Declined smoking cessation: - Last visit 07/13/2019. During that encounter smoking cessation offered and patient declined. - Today patient reports still smoking half pack to 1 pack daily. States she is not ready to quit smoking - Follow-up with primary physician as needed.   Follow Up Instructions: Follow-up with primary physician in 3 months or sooner if needed for management of chronic conditions   Patient was given clear instructions to go to Emergency Department or return to medical center if symptoms don't improve, worsen, or new problems develop.The patient verbalized understanding.  I discussed the assessment and treatment plan with the patient. The patient was provided an opportunity to ask questions and all were answered. The patient agreed with the plan and demonstrated an understanding of the instructions.   The patient was advised to call back or seek an in-person evaluation if the symptoms worsen or if the condition fails to improve as anticipated.   I provided 15 minutes total of non-face-to-face time during this encounter including median intraservice time, reviewing previous notes, labs, imaging, medications, management and patient verbalized understanding.    Rema Fendt, NP  [facility] [place], [place] [phone]   10/20/2019, 10:38 AM

## 2019-10-21 ENCOUNTER — Other Ambulatory Visit: Payer: Self-pay | Admitting: Family Medicine

## 2019-10-21 DIAGNOSIS — K219 Gastro-esophageal reflux disease without esophagitis: Secondary | ICD-10-CM

## 2019-11-10 ENCOUNTER — Telehealth: Payer: Self-pay | Admitting: Licensed Clinical Social Worker

## 2019-11-10 ENCOUNTER — Institutional Professional Consult (permissible substitution): Payer: Self-pay | Admitting: Licensed Clinical Social Worker

## 2019-11-10 NOTE — Telephone Encounter (Signed)
LCSW placed call to patient regarding scheduled [facility] appointment. LCSW left message requesting a return call.  

## 2021-01-03 ENCOUNTER — Emergency Department (HOSPITAL_COMMUNITY)
Admission: EM | Admit: 2021-01-03 | Discharge: 2021-01-04 | Disposition: A | Discharge status: Home / Self Care | Payer: Self-pay | Attending: Emergency Medicine | Admitting: Emergency Medicine

## 2021-01-03 ENCOUNTER — Encounter (HOSPITAL_COMMUNITY): Payer: Self-pay | Admitting: Emergency Medicine

## 2021-01-03 ENCOUNTER — Emergency Department (HOSPITAL_COMMUNITY): Payer: Self-pay

## 2021-01-03 DIAGNOSIS — F17210 Nicotine dependence, cigarettes, uncomplicated: Secondary | ICD-10-CM | POA: Insufficient documentation

## 2021-01-03 DIAGNOSIS — R079 Chest pain, unspecified: Secondary | ICD-10-CM

## 2021-01-03 DIAGNOSIS — Z8616 Personal history of COVID-19: Secondary | ICD-10-CM | POA: Insufficient documentation

## 2021-01-03 DIAGNOSIS — R1013 Epigastric pain: Secondary | ICD-10-CM | POA: Insufficient documentation

## 2021-01-03 DIAGNOSIS — Z96649 Presence of unspecified artificial hip joint: Secondary | ICD-10-CM | POA: Insufficient documentation

## 2021-01-03 DIAGNOSIS — R0789 Other chest pain: Secondary | ICD-10-CM | POA: Insufficient documentation

## 2021-01-03 DIAGNOSIS — M5441 Lumbago with sciatica, right side: Secondary | ICD-10-CM | POA: Insufficient documentation

## 2021-01-03 DIAGNOSIS — G8929 Other chronic pain: Secondary | ICD-10-CM | POA: Insufficient documentation

## 2021-01-03 LAB — URINALYSIS, ROUTINE W REFLEX MICROSCOPIC
Bilirubin Urine: NEGATIVE
Glucose, UA: NEGATIVE mg/dL
Hgb urine dipstick: NEGATIVE
Ketones, ur: NEGATIVE mg/dL
Nitrite: NEGATIVE
Protein, ur: NEGATIVE mg/dL
Specific Gravity, Urine: 1.019 (ref 1.005–1.030)
pH: 6 (ref 5.0–8.0)

## 2021-01-03 LAB — COMPREHENSIVE METABOLIC PANEL
ALT: 31 U/L (ref 0–44)
AST: 37 U/L (ref 15–41)
Albumin: 3 g/dL — ABNORMAL LOW (ref 3.5–5.0)
Alkaline Phosphatase: 48 U/L (ref 38–126)
Anion gap: 10 (ref 5–15)
BUN: 6 mg/dL (ref 6–20)
CO2: 25 mmol/L (ref 22–32)
Calcium: 8.9 mg/dL (ref 8.9–10.3)
Chloride: 101 mmol/L (ref 98–111)
Creatinine, Ser: 0.41 mg/dL — ABNORMAL LOW (ref 0.44–1.00)
GFR, Estimated: >60 mL/min (ref >60)
Glucose, Bld: 93 mg/dL (ref 70–99)
Potassium: 3.1 mmol/L — ABNORMAL LOW (ref 3.5–5.1)
Sodium: 136 mmol/L (ref 135–145)
Total Bilirubin: 0.9 mg/dL (ref 0.3–1.2)
Total Protein: 9.5 g/dL — ABNORMAL HIGH (ref 6.5–8.1)

## 2021-01-03 LAB — CBC WITH DIFFERENTIAL/PLATELET
Abs Immature Granulocytes: 0.01 10*3/uL (ref 0.00–0.07)
Basophils Absolute: 0 10*3/uL (ref 0.0–0.1)
Basophils Relative: 0 %
Eosinophils Absolute: 0.1 10*3/uL (ref 0.0–0.5)
Eosinophils Relative: 3 %
HCT: 39.6 % (ref 36.0–46.0)
Hemoglobin: 12.3 g/dL (ref 12.0–15.0)
Immature Granulocytes: 0 %
Lymphocytes Relative: 30 %
Lymphs Abs: 1.2 10*3/uL (ref 0.7–4.0)
MCH: 29.8 pg (ref 26.0–34.0)
MCHC: 31.1 g/dL (ref 30.0–36.0)
MCV: 95.9 fL (ref 80.0–100.0)
Monocytes Absolute: 0.4 10*3/uL (ref 0.1–1.0)
Monocytes Relative: 11 %
Neutro Abs: 2.2 10*3/uL (ref 1.7–7.7)
Neutrophils Relative %: 56 %
Platelets: 211 10*3/uL (ref 150–400)
RBC: 4.13 MIL/uL (ref 3.87–5.11)
RDW: 13.3 % (ref 11.5–15.5)
WBC: 3.9 10*3/uL — ABNORMAL LOW (ref 4.0–10.5)
nRBC: 0 % (ref 0.0–0.2)

## 2021-01-03 LAB — TROPONIN I (HIGH SENSITIVITY)
Troponin I (High Sensitivity): 5 ng/L (ref <18)
Troponin I (High Sensitivity): 6 ng/L (ref <18)

## 2021-01-03 NOTE — ED Provider Notes (Signed)
Emergency Medicine Provider Triage Evaluation Note  Lisa Foley , a 50 y.o. female  was evaluated in triage.  Pt complains of diffuse pain.  States 3 days ago she noted diffuse pain to her chest.  Also some and into her right side.  Pain is constant.  No headache.  She feels generally weak.  No history of AAA or dissection.  No back pain.  Feels that she has a scratchy throat.  Review of Systems  Positive: Chest pain, abdominal pain, generalized Negative: Back pain,  Physical Exam  BP 128/80 (BP Location: Left Arm)   Pulse 96   Temp 98.2 F (36.8 C)   Resp 18   SpO2 97%  Gen:   Awake, no distress   Resp:  Normal effort  MSK:   Moves extremities without difficulty  Neuro:  CN 2-12 intact. Equal sensation, Ambulatory Other:    Medical Decision Making  Medically screening exam initiated at 6:21 PM.  Appropriate orders placed.  Lisa Foley was informed that the remainder of the evaluation will be completed by another provider, this initial triage assessment does not replace that evaluation, and the importance of remaining in the ED until their evaluation is complete.  Progression chest pain, abdominal pain.  Nonfocal neuro exam.  Intact pulses, normotensive.  Low suspicion for AAA, dissection  Work-up started   Mikiyah Glasner A, PA-C 01/03/21 1821    Rolan Bucco, MD 01/03/21 [id number]

## 2021-01-03 NOTE — ED Triage Notes (Signed)
Patient with history of lupus here for chest, neck, lower back, and bilateral leg pain that started on Sunday 12/30/2020. Patient alert, oriented, and in no apparent distress at this time.

## 2021-01-04 NOTE — ED Notes (Signed)
Pt has multiple complaints. Chest pain which is reproducible w/ palpitation, leg weakness (due to lupus).  Pt does not have a primary provider to manage symtoms, given and reviewed referrals for assistance.

## 2021-01-04 NOTE — ED Provider Notes (Signed)
[facility] EMERGENCY DEPARTMENT Provider Note   CSN: [id number] Arrival date & time: 01/03/21  1735     History Chief Complaint  Patient presents with   Chest Pain    Lisa Foley is a 50 y.o. female.  49 yoF with a chief complaint of chest pain.  This is been ongoing for many weeks.  She tells me that it got a little bit worse and she felt like her right leg was suddenly weak yesterday and so decided to come to the ED for evaluation.  She is a lot trouble describing her right leg symptoms.  She does have some pain but has trouble saying where it is.  Tells me it does not feel right.  She has been able to ambulate without issue.  Denies loss of bowel or bladder denies loss of peritoneal sensation denies numbness to the leg.  She denies trauma.  Nothing seems to make the chest pain better or worse.  She denies cough congestion or fever.  Denies abdominal pain.  The history is provided by the patient.  Chest Pain Pain location:  Epigastric Pain quality: aching   Pain radiates to:  Neck Pain severity:  Moderate Onset quality:  Gradual Duration:  48 months Timing:  Intermittent Progression:  Waxing and waning Chronicity:  Chronic Relieved by:  Nothing Worsened by:  Nothing Ineffective treatments:  None tried Associated symptoms: no dizziness, no fever, no headache, no nausea, no palpitations, no shortness of breath and no vomiting       Past Medical History:  Diagnosis Date   Acute bronchitis due to COVID-19 virus 09/05/2018   Carpal tunnel syndrome    Gastroenteritis due to COVID-19 virus 09/05/2018   Lupus [facility])     Patient Active Problem List   Diagnosis Date Noted   Hypokalemia 07/02/2019   Nausea and vomiting 07/02/2019   Diarrhea 07/02/2019   Grief 07/02/2019   MDD (major depressive disorder), single episode, moderate (HCC) 07/02/2019   Hypomagnesemia 07/02/2019   Lupus (HCC) 06/08/2015   Nicotine dependence, cigarettes, uncomplicated  06/08/2015   Carpal tunnel syndrome    Vitamin D deficiency 01/01/2010   Osteoarthritis of right hip 06/28/2009    Past Surgical History:  Procedure Laterality Date   TOTAL HIP ARTHROPLASTY     TUBAL LIGATION       OB History   No obstetric history on file.     Family History  Problem Relation Age of Onset   Cancer Father    High blood pressure Mother    High blood pressure Brother    High blood pressure Brother    Lupus Sister    Lupus Sister     Social History   Tobacco Use   Smoking status: Every Day    Types: Cigarettes    Last attempt to quit: 04/28/2015    Years since quitting: 5.6   Smokeless tobacco: Never  Vaping Use   Vaping Use: Never used  Substance Use Topics   Alcohol use: Yes    Alcohol/week: 0.0 standard drinks    Comment: occasional   Drug use: No    Home Medications Prior to Admission medications   Medication Sig Start Date End Date Taking? Authorizing Provider  acetaminophen (TYLENOL) 500 MG tablet Take 2 tablets (1,000 mg total) by mouth every 8 (eight) hours as needed for mild pain. Do NOT exceed 4,000mg  in 24 hours Patient taking differently: Take 500 mg by mouth as needed for moderate pain or headache. 09/07/18  Yes Tyrone Nine, MD  Menthol, Topical Analgesic, (ICY HOT EX) Apply 1-2 each topically 2 (two) times daily as needed (pain).   Yes [provider]  budesonide-formoterol (SYMBICORT) 160-4.5 MCG/ACT inhaler Inhale 2 puffs into the lungs 2 (two) times daily. 09/07/18 09/07/19  Tyrone Nine, MD  famotidine (PEPCID) 20 MG tablet TAKE 1 TABLET BY MOUTH TWICE A DAY Patient not taking: No sig reported 10/21/19   Cain Saupe, MD    Allergies    Advair hfa [fluticasone-salmeterol] and Aleve [naproxen sodium]  Review of Systems   Review of Systems  Constitutional:  Negative for chills and fever.  HENT:  Negative for congestion and rhinorrhea.   Eyes:  Negative for redness and visual disturbance.  Respiratory:  Negative for  shortness of breath and wheezing.   Cardiovascular:  Positive for chest pain. Negative for palpitations.  Gastrointestinal:  Negative for nausea and vomiting.  Genitourinary:  Negative for dysuria and urgency.  Musculoskeletal:  Negative for arthralgias and myalgias.  Skin:  Negative for pallor and wound.  Neurological:  Negative for dizziness and headaches.   Physical Exam Updated Vital Signs BP 119/60   Pulse 72   Temp 98 F (36.7 C) (Oral)   Resp 18   SpO2 97%   Physical Exam Vitals and nursing note reviewed.  Constitutional:      General: She is not in acute distress.    Appearance: She is well-developed. She is not diaphoretic.  HENT:     Head: Normocephalic and atraumatic.  Eyes:     Pupils: Pupils are equal, round, and reactive to light.  Cardiovascular:     Rate and Rhythm: Normal rate and regular rhythm.     Heart sounds: No murmur heard.   No friction rub. No gallop.  Pulmonary:     Effort: Pulmonary effort is normal.     Breath sounds: No wheezing or rales.  Chest:     Chest wall: Tenderness present.     Comments: Pain across the anterior aspect of the chest.  Reproduces her symptoms. Abdominal:     General: There is no distension.     Palpations: Abdomen is soft.     Tenderness: There is no abdominal tenderness.  Musculoskeletal:        General: No tenderness.     Cervical back: Normal range of motion and neck supple.     Comments: Difficult to palpate pulses in either feet.  Patient has a strong triphasic posterior tibialis pulse on the right.  Sensation intact to light touch in all nerve distributions.  Reflexes are 2+ and equal.  No clonus.  Mild right paraspinal muscular tenderness.  No midline spinal tenderness step-offs or deformities.  Skin:    General: Skin is warm and dry.  Neurological:     Mental Status: She is alert and oriented to person, place, and time.  Psychiatric:        Behavior: Behavior normal.    ED Results / Procedures /  Treatments   Labs (all labs ordered are listed, but only abnormal results are displayed) Labs Reviewed  CBC WITH DIFFERENTIAL/PLATELET - Abnormal; Notable for the following components:      Result Value   WBC 3.9 (*)    All other components within normal limits  COMPREHENSIVE METABOLIC PANEL - Abnormal; Notable for the following components:   Potassium 3.1 (*)    Creatinine, Ser 0.41 (*)    Total Protein 9.5 (*)    Albumin 3.0 (*)  All other components within normal limits  URINALYSIS, ROUTINE W REFLEX MICROSCOPIC - Abnormal; Notable for the following components:   APPearance HAZY (*)    Leukocytes,Ua TRACE (*)    Bacteria, UA RARE (*)    All other components within normal limits  RESP PANEL BY RT-PCR (FLU A&B, COVID) ARPGX2  TROPONIN I (HIGH SENSITIVITY)  TROPONIN I (HIGH SENSITIVITY)    EKG EKG Interpretation  Date/Time:  Thursday January 03 2021 17:49:13 EDT Ventricular Rate:  89 PR Interval:  186 QRS Duration: 90 QT Interval:  366 QTC Calculation: 445 R Axis:   31 Text Interpretation: Normal sinus rhythm Cannot rule out Anterior infarct , age undetermined Abnormal ECG No significant change since last tracing Confirmed by Melene Plan [phone]) on 01/04/2021 5:28:19 AM  Radiology DG Chest 2 View  Result Date: 01/03/2021 CLINICAL DATA:  Pain EXAM: CHEST - 2 VIEW COMPARISON:  07/15/2019 FINDINGS: Heart is normal size. Diffuse interstitial prominence throughout the lungs, likely chronic lung disease. Areas of scarring in the lingula. No acute confluent opacities or effusions. No acute bony abnormality. IMPRESSION: Stable chronic changes throughout the lungs.  No active disease. Electronically Signed   By: Charlett Nose M.D.   On: 01/03/2021 19:04    Procedures Procedures   Medications Ordered in ED Medications - No data to display  ED Course  I have reviewed the triage vital signs and the nursing notes.  Pertinent labs & imaging results that were available during my  care of the patient were reviewed by me and considered in my medical decision making (see chart for details).    MDM Rules/Calculators/A&P                           50 yo F with multiple complaints.  Patient was seen in [facility] and the chief complaint was chest discomfort describes somewhat of a scratchy throat.  She denies any viral type symptoms to me but endorses sudden onset weakness to the right lower extremity that occurred yesterday.  I do not appreciate any weakness.  She has no diminished sensation in that leg.  Reflexes are 2+ and equal.  Her chest pain is reproduced with palpation of the anterior chest wall and consistent with chest wall pain.  She has 2 troponins that are negative EKG without concerning finding chest x-ray viewed by me without focal infiltrate.  5:52 AM:  I have discussed the diagnosis/risks/treatment options with the patient and believe the pt to be eligible for discharge home to follow-up with PCP. We also discussed returning to the ED immediately if new or worsening sx occur. We discussed the sx which are most concerning (e.g., sudden worsening pain, fever, inability to tolerate by mouth, cauda equina s/sx) that necessitate immediate return. Medications administered to the patient during their visit and any new prescriptions provided to the patient are listed below.  Medications given during this visit Medications - No data to display   The patient appears reasonably screen and/or stabilized for discharge and I doubt any other medical condition or other [facility] requiring further screening, evaluation, or treatment in the ED at this time prior to discharge.   Final Clinical Impression(s) / ED Diagnoses Final diagnoses:  Nonspecific chest pain  Chronic right-sided low back pain with right-sided sciatica    Rx / DC Orders ED Discharge Orders     None        Melene Plan, DO 01/04/21 [phone]

## 2021-01-04 NOTE — Discharge Instructions (Signed)
Most likely her chest pain is muscular.  Try Tylenol and ibuprofen or naproxen for this.  You have lupus, that could lead to all sorts of different medical problems.  You need to have normal follow-up with an outpatient physician for this.  Please call the hotline number to talk to a social worker to try and establish with 1.  I have given you information to follow-up with the sports medicine doctor for your back.  Please give them a call and see when they can see you in the office.  Please return for difficulty peeing or pooping or if you cannot feel any white with toilet paper or if develop a fever.

## 2021-01-10 ENCOUNTER — Ambulatory Visit: Payer: Self-pay | Admitting: Family Medicine

## 2021-01-10 NOTE — Progress Notes (Deleted)
   I, Philbert Riser, LAT, ATC acting as a scribe for Clementeen Graham, MD.  Subjective:    CC: Chest pain  HPI: Pt is a 50 y/o female c/o chest pain ongoing for several weeks. Pt was [facility] ED on 01/03/21 w/ this compliant and labs, an EKG and XR were performed. Pt locates pain to   Radiates:  UE Numbness/tingling: UE Weakness: Aggravates: Treatments tried:  Dx testing: 01/03/21 Labs (Troponin, CMET, CBC w/ differential, urinalysis) 01/03/21 Chest XR  07/15/19 Chest XR  07/01/19 Chest XR Pertinent review of Systems: ***  Relevant historical information: ***   Objective:   There were no vitals filed for this visit. General: Well Developed, well nourished, and in no acute distress.   MSK: ***  Lab and Radiology Results No results found for this or any previous visit (from the past 72 hour(s)). No results found.    Impression and Recommendations:    Assessment and Plan: 50 y.o. female with ***.  PDMP not reviewed this encounter. No orders of the defined types were placed in this encounter.  No orders of the defined types were placed in this encounter.   Discussed warning signs or symptoms. Please see discharge instructions. Patient expresses understanding.   ***

## 2021-04-28 IMAGING — DX PORTABLE CHEST - 1 VIEW
1 series · 1 of 1 positions shown · non-contrast
Comparison: Yesterday

CLINICAL DATA: [id number]

EXAM:
PORTABLE CHEST 1 VIEW

[chest]
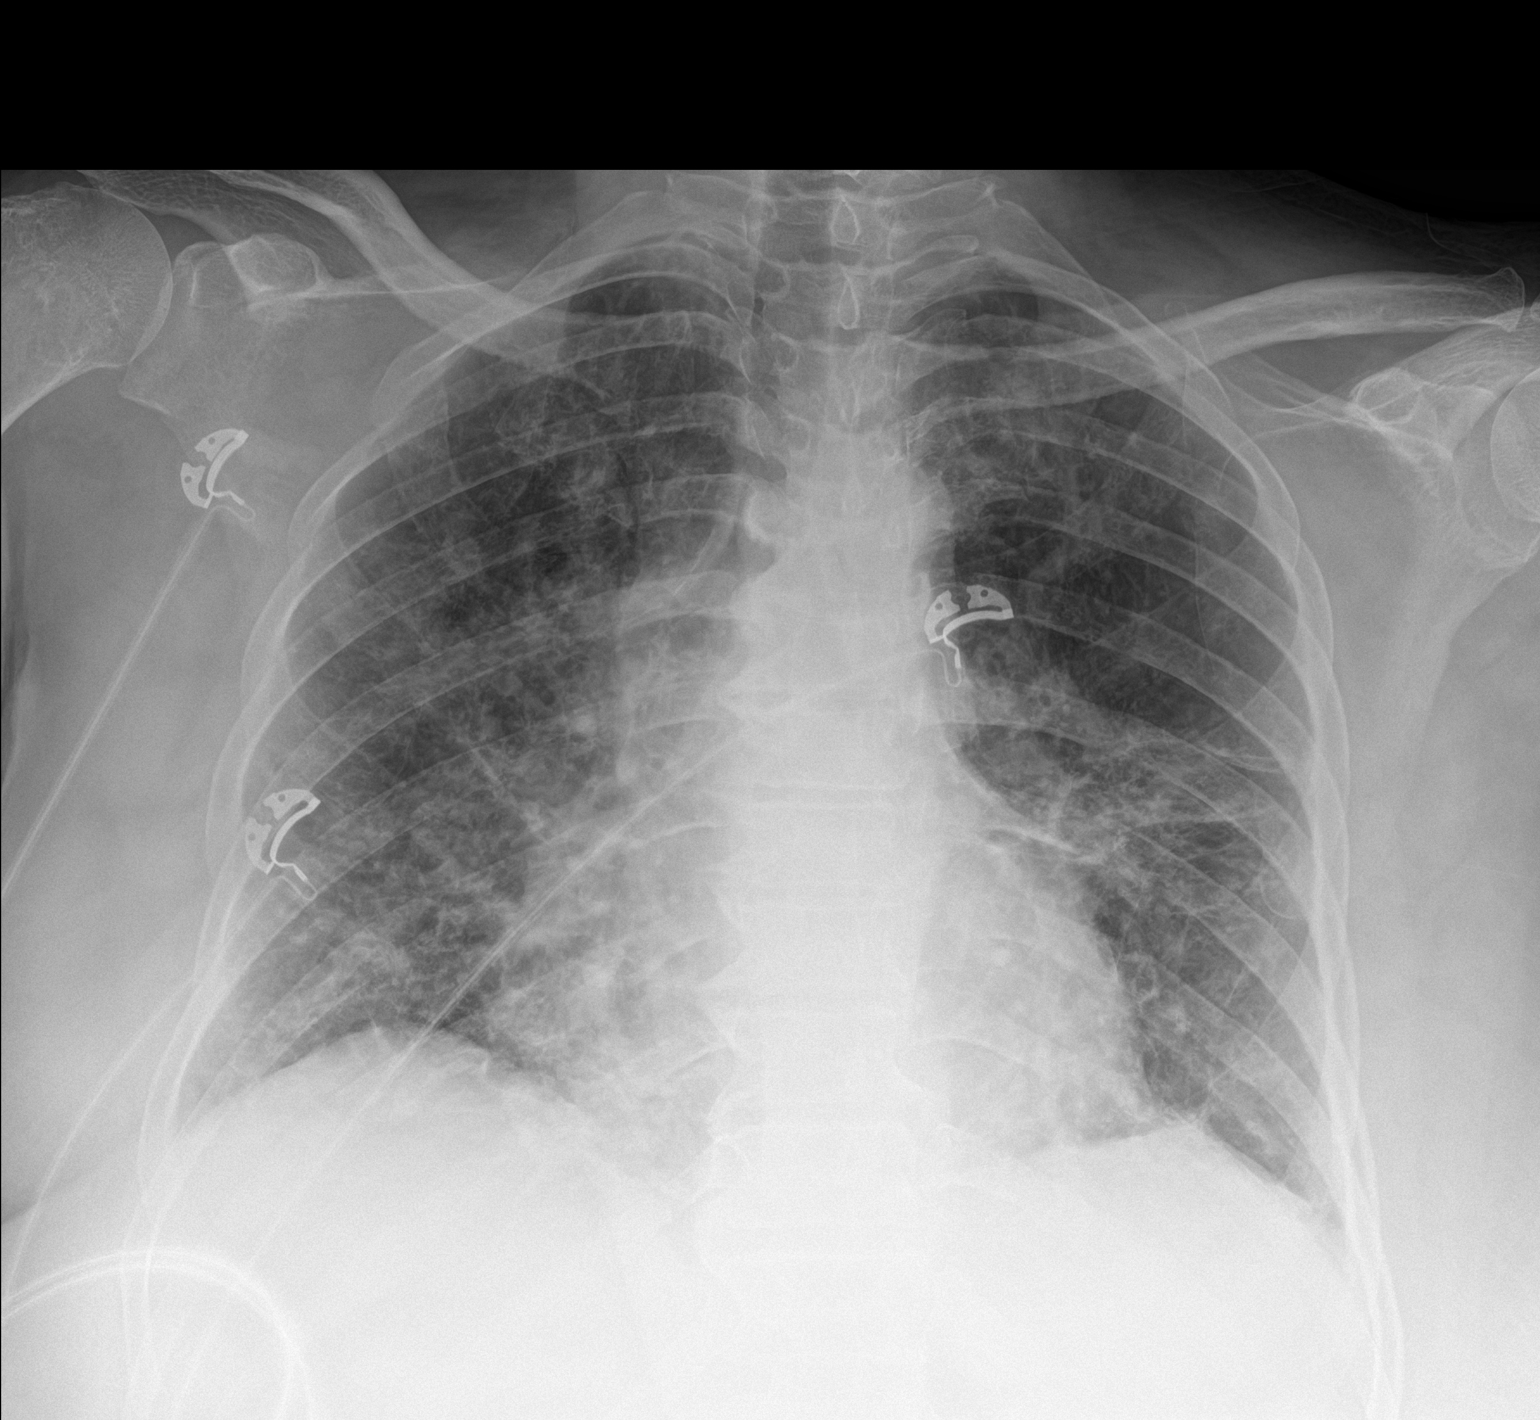

[1 of 1 positions shown; findings below may reference images not displayed]

FINDINGS: Worsening patchy bilateral infiltrate. There is chronic lung disease
with scarring based on 2608 chest x-ray. Normal heart size and
stable mediastinal contours. No effusion or pneumothorax.
IMPRESSION: Chronic lung disease with patchy bilateral infiltrate that has
increased from yesterday.

## 2021-05-01 ENCOUNTER — Emergency Department (HOSPITAL_COMMUNITY): Payer: Self-pay

## 2021-05-01 ENCOUNTER — Other Ambulatory Visit: Payer: Self-pay

## 2021-05-01 ENCOUNTER — Encounter (HOSPITAL_COMMUNITY): Payer: Self-pay | Admitting: Emergency Medicine

## 2021-05-01 ENCOUNTER — Emergency Department (HOSPITAL_COMMUNITY)
Admission: EM | Admit: 2021-05-01 | Discharge: 2021-05-01 | Disposition: A | Discharge status: Home / Self Care | Payer: Self-pay | Attending: Emergency Medicine | Admitting: Emergency Medicine

## 2021-05-01 DIAGNOSIS — J101 Influenza due to other identified influenza virus with other respiratory manifestations: Secondary | ICD-10-CM | POA: Insufficient documentation

## 2021-05-01 DIAGNOSIS — Z7951 Long term (current) use of inhaled steroids: Secondary | ICD-10-CM | POA: Insufficient documentation

## 2021-05-01 DIAGNOSIS — J111 Influenza due to unidentified influenza virus with other respiratory manifestations: Secondary | ICD-10-CM

## 2021-05-01 DIAGNOSIS — Z20822 Contact with and (suspected) exposure to covid-19: Secondary | ICD-10-CM | POA: Insufficient documentation

## 2021-05-01 LAB — URINALYSIS, ROUTINE W REFLEX MICROSCOPIC
Bilirubin Urine: NEGATIVE
Glucose, UA: NEGATIVE mg/dL
Hgb urine dipstick: NEGATIVE
Ketones, ur: NEGATIVE mg/dL
Leukocytes,Ua: NEGATIVE
Nitrite: NEGATIVE
Protein, ur: 30 mg/dL — AB
Specific Gravity, Urine: 1.02 (ref 1.005–1.030)
pH: 5 (ref 5.0–8.0)

## 2021-05-01 LAB — COMPREHENSIVE METABOLIC PANEL
ALT: 26 U/L (ref 0–44)
AST: 43 U/L — ABNORMAL HIGH (ref 15–41)
Albumin: 3.3 g/dL — ABNORMAL LOW (ref 3.5–5.0)
Alkaline Phosphatase: 50 U/L (ref 38–126)
Anion gap: 9 (ref 5–15)
BUN: 7 mg/dL (ref 6–20)
CO2: 24 mmol/L (ref 22–32)
Calcium: 8.9 mg/dL (ref 8.9–10.3)
Chloride: 100 mmol/L (ref 98–111)
Creatinine, Ser: 0.49 mg/dL (ref 0.44–1.00)
GFR, Estimated: >60 mL/min (ref >60)
Glucose, Bld: 133 mg/dL — ABNORMAL HIGH (ref 70–99)
Potassium: 3.6 mmol/L (ref 3.5–5.1)
Sodium: 133 mmol/L — ABNORMAL LOW (ref 135–145)
Total Bilirubin: 0.3 mg/dL (ref 0.3–1.2)
Total Protein: 9.9 g/dL — ABNORMAL HIGH (ref 6.5–8.1)

## 2021-05-01 LAB — CBC
HCT: 39.5 % (ref 36.0–46.0)
Hemoglobin: 12.2 g/dL (ref 12.0–15.0)
MCH: 29.6 pg (ref 26.0–34.0)
MCHC: 30.9 g/dL (ref 30.0–36.0)
MCV: 95.9 fL (ref 80.0–100.0)
Platelets: 154 10*3/uL (ref 150–400)
RBC: 4.12 MIL/uL (ref 3.87–5.11)
RDW: 14 % (ref 11.5–15.5)
WBC: 2.4 10*3/uL — ABNORMAL LOW (ref 4.0–10.5)
nRBC: 0 % (ref 0.0–0.2)

## 2021-05-01 LAB — RESP PANEL BY RT-PCR (FLU A&B, COVID) ARPGX2
Influenza A by PCR: POSITIVE — AB
Influenza B by PCR: NEGATIVE
SARS Coronavirus 2 by RT PCR: NEGATIVE

## 2021-05-01 LAB — LIPASE, BLOOD: Lipase: 31 U/L (ref 11–51)

## 2021-05-01 MED ORDER — ALBUTEROL SULFATE HFA 108 (90 BASE) MCG/ACT IN AERS
2.0000 | INHALATION_SPRAY | Freq: Once | RESPIRATORY_TRACT | Status: AC
  Administered 2021-05-01: 2 via RESPIRATORY_TRACT
  Filled 2021-05-01: qty 6.7

## 2021-05-01 MED ORDER — TRIAMCINOLONE ACETONIDE 0.1 % EX CREA: 1.0000 "application " | TOPICAL_CREAM | Freq: Two times a day (BID) | CUTANEOUS | 0 refills | Status: AC

## 2021-05-01 MED ORDER — ONDANSETRON HCL 4 MG/2ML IJ SOLN
4.0000 mg | Freq: Once | INTRAMUSCULAR | Status: AC
  Administered 2021-05-01: 4 mg via INTRAVENOUS
  Filled 2021-05-01: qty 2

## 2021-05-01 MED ORDER — SODIUM CHLORIDE 0.9 % IV BOLUS
1000.0000 mL | Freq: Once | INTRAVENOUS | Status: AC
  Administered 2021-05-01: 1000 mL via INTRAVENOUS

## 2021-05-01 MED ORDER — ACETAMINOPHEN 325 MG PO TABS
650.0000 mg | ORAL_TABLET | Freq: Once | ORAL | Status: AC
  Administered 2021-05-01: 650 mg via ORAL
  Filled 2021-05-01: qty 2

## 2021-05-01 MED ORDER — FAMOTIDINE IN NACL 20-0.9 MG/50ML-% IV SOLN
20.0000 mg | Freq: Once | INTRAVENOUS | Status: AC
  Administered 2021-05-01: 20 mg via INTRAVENOUS
  Filled 2021-05-01: qty 50

## 2021-05-01 NOTE — ED Provider Triage Note (Signed)
Emergency Medicine Provider Triage Evaluation Note  Lisa Foley , a 51 y.o. female  was evaluated in triage.  Pt complains of generalized body aches, nausea, vomiting, diarrhea and cough.  Patient reports that her symptoms started on Saturday and has been consistent since then.  Patient is able to tolerate liquids but has not had any solid food in the last few days.  Patient describes emesis as bilious with no blood or hematemesis.  Denies any melena or blood in stool.  Patient reports productive cough and generalized body aches.  No known sick contacts.  Patient has not been vaccinated for COVID-19 or influenza.  Denies any recent antibiotic use, international travel, or camping.  LMP 3 years prior.  Review of Systems  Positive: Generalized body aches, nausea, vomiting, diarrhea, cough Negative: Abdominal pain, blood in stool, melena, hematemesis, coffee-ground emesis, vaginal pain, vaginal bleeding, vaginal discharge, dysuria, hematuria, urinary urgency  Physical Exam  BP 119/69 (BP Location: Right Arm)    Pulse 98    Temp 98.6 F (37 C) (Oral)    Resp (!) 24    SpO2 98%  Gen:   Awake, no distress   Resp:  Normal effort, clear to auscultation bilaterally MSK:   Moves extremities without difficulty  Other:  Abdomen soft, nondistended, nontender with no guarding or rebound tenderness.  Medical Decision Making  Medically screening exam initiated at 1:27 PM.  Appropriate orders placed.  Lisa Foley was informed that the remainder of the evaluation will be completed by another provider, this initial triage assessment does not replace that evaluation, and the importance of remaining in the ED until their evaluation is complete.     Loni Beckwith, [place] 05/01/21 1329

## 2021-05-01 NOTE — ED Triage Notes (Signed)
Patient complains of generalized body aches, emesis, and diarrhea that started on Saturday. Patient alert, oriented, afebrile, SpO2 98% on room air, and in no apparent distress at this time.

## 2021-05-01 NOTE — ED Notes (Signed)
Pt tolerating Cola to drink

## 2021-05-01 NOTE — Discharge Instructions (Addendum)
Please refer to the attached instructions. Follow-up with pulmonology as discussed.

## 2021-05-01 NOTE — ED Provider Notes (Signed)
[facility] EMERGENCY DEPARTMENT Provider Note   CSN: [id number] Arrival date & time: 05/01/21  1229     History  Chief Complaint  Patient presents with   Emesis    Lisa Foley is a 51 y.o. female.  Patient presents with body aches, cough, nausea, vomiting and diarrhea x 5 days. Is currently tolerating oral liquids, but no solid foods.   The history is provided by the patient.  Emesis     Home Medications Prior to Admission medications   Medication Sig Start Date End Date Taking? Authorizing Provider  acetaminophen (TYLENOL) 500 MG tablet Take 2 tablets (1,000 mg total) by mouth every 8 (eight) hours as needed for mild pain. Do NOT exceed 4,000mg  in 24 hours Patient taking differently: Take 500 mg by mouth as needed for moderate pain or headache. 09/07/18   Tyrone Nine, MD  budesonide-formoterol [facility]) 160-4.5 MCG/ACT inhaler Inhale 2 puffs into the lungs 2 (two) times daily. 09/07/18 09/07/19  Tyrone Nine, MD  famotidine (PEPCID) 20 MG tablet TAKE 1 TABLET BY MOUTH TWICE A DAY Patient not taking: No sig reported 10/21/19   Fulp, Cammie, MD  Menthol, Topical Analgesic, (ICY HOT EX) Apply 1-2 each topically 2 (two) times daily as needed (pain).    [provider]      Allergies    Advair hfa [fluticasone-salmeterol] and Aleve [naproxen sodium]    Review of Systems   Review of Systems  Gastrointestinal:  Positive for vomiting.   Physical Exam Updated Vital Signs BP 119/69 (BP Location: Right Arm)    Pulse 98    Temp 98.6 F (37 C) (Oral)    Resp (!) 24    SpO2 98%  Physical Exam  ED Results / Procedures / Treatments   Labs (all labs ordered are listed, but only abnormal results are displayed) Labs Reviewed  RESP PANEL BY RT-PCR (FLU A&B, COVID) ARPGX2 - Abnormal; Notable for the following components:      Result Value   Influenza A by PCR POSITIVE (*)    All other components within normal limits  COMPREHENSIVE METABOLIC  PANEL - Abnormal; Notable for the following components:   Sodium 133 (*)    Glucose, Bld 133 (*)    Total Protein 9.9 (*)    Albumin 3.3 (*)    AST 43 (*)    All other components within normal limits  CBC - Abnormal; Notable for the following components:   WBC 2.4 (*)    All other components within normal limits  URINALYSIS, ROUTINE W REFLEX MICROSCOPIC - Abnormal; Notable for the following components:   Color, Urine AMBER (*)    APPearance HAZY (*)    Protein, ur 30 (*)    Bacteria, UA FEW (*)    All other components within normal limits  LIPASE, BLOOD    EKG None  Radiology DG Chest 2 View  Result Date: 05/01/2021 CLINICAL DATA:  cough EXAM: CHEST - 2 VIEW COMPARISON:  October 27, 22. FINDINGS: Chronic interstitial opacities bilaterally with mildly increased airspace opacities in the right upper lung. No visible pleural effusions or pneumothorax. Similar cardiomediastinal silhouette. Degenerative changes of the spine. IMPRESSION: The patient's chronic interstitial lung disease limits evaluation, but there is mildly increased airspace opacities in the right upper lung that could potentially represent superimposed pneumonia. Given the extent of chronic lung disease, recommend follow-up chest CT to better characterize and to establish a baseline. Electronically Signed   By: Juluis Mire.D.  On: 05/01/2021 14:23   CT Chest Wo Contrast  Result Date: 05/01/2021 CLINICAL DATA:  Abnormal xray - lung opacity/opacities abnormal CXR findings, evaluate for pneumonia EXAM: CT CHEST WITHOUT CONTRAST TECHNIQUE: Multidetector CT imaging of the chest was performed following the standard protocol without IV contrast. RADIATION DOSE REDUCTION: This exam was performed according to the departmental dose-optimization program which includes automated exposure control, adjustment of the mA and/or kV according to patient size and/or use of iterative reconstruction technique. COMPARISON:  Radiograph  earlier today scattered additional prior radiographs reviewed. Report from outside CT 06/19/2015 reviewed, no images available. FINDINGS: Cardiovascular: The heart is normal in size. The thoracic aorta is normal in caliber. There is no pericardial effusion. Mediastinum/Nodes: Homogeneous anterior mediastinal mass with Hounsfield units of 57 measures 4.3 x 2.3 cm. Small calcified right lower paratracheal and bilateral hilar lymph nodes. No bulky noncalcified mediastinal adenopathy. Limited assessment for hilar lymph nodes given lack of IV contrast. There is bilateral axillary adenopathy is well as prominent subpectoral nodes, 11 mm low right axillary node, series 3 image 31. 13 mm right subpectoral node series 3, image 14. In elongated left hilar node spans 3.6 cm, however this is primarily fatty hilum and has thin cortex, no abnormal rounded morphology. No visualized thyroid nodule. Small hiatal hernia. Lungs/Pleura: There is underlying chronic lung disease with areas of architectural distortion. Multifocal bronchiectasis, most prominent centrally and affecting the perifissural lingular and right middle lobes. Left upper lobe bronchiectasis has surrounding ill-defined soft tissue density. There is ill-defined bilateral perihilar density. Irregular nodular opacities in the right upper lobe measuring 14 x 12 mm, series 8, image 59, and 17 x 12 mm series 8, image 70 both of these nodules have irregular spiculated margins, and are contiguous with right perihilar density. There are scattered reticulonodular opacities within both lungs. No pleural effusion. Upper Abdomen: Question of nodular hepatic contours. Spleen is prominent size. There is a small posterior gastric diverticulum. Musculoskeletal: Thoracic spondylosis with anterior spurring. High-riding right humeral head. There are no acute or suspicious osseous abnormalities. No chest wall soft tissue abnormalities or obvious breast mass by CT. IMPRESSION: 1.  Underlying chronic lung disease. There is architectural distortion as well as multifocal areas of bronchiectasis. There is irregular soft tissue density in the region of bronchiectasis at the left lung apex, in both perihilar regions. Recommend pulmonary consultation. Consider follow-up high-resolution chest CT to assess for interstitial lung disease. 2. Spiculated nodular opacities in the right upper lobe measuring 1-2 cm are contiguous with right perihilar soft tissue density. This may be related to underlying chronic process, although the possibility of superimposed infection or neoplasm is also considered. 3. Diffuse reticulonodular opacities throughout both lungs, acuity uncertain. 4. Calcified mediastinal and bilateral hilar lymph nodes. This may represent sequela of prior granulomatous disease, or sarcoidosis, given the parenchymal lung findings. 5. Bilateral axillary and subpectoral adenopathy, nonspecific but may be reactive. 6. Homogeneous anterior mediastinal mass measuring 4.3 x 2.3 cm. This may represent a recurrent thymus. This can be assessed on follow-up, or consideration for PET-CT characterization based on workup of underlying chronic lung disease could be considered. 7. Question of nodular hepatic contours, raising concern for cirrhosis. Recommend correlation with cirrhosis risk factors. 8. Small hiatal hernia. Electronically Signed   By: Narda Rutherford M.D.   On: 05/01/2021 17:23    Procedures Procedures    Medications Ordered in ED Medications  sodium chloride 0.9 % bolus 1,000 mL (1,000 mLs Intravenous New Bag/Given 05/01/21 1742)  ondansetron (  ZOFRAN) injection 4 mg (4 mg Intravenous Given 05/01/21 1742)    ED Course/ Medical Decision Making/ A&P    6:56 PM Patient's nausea and vomiting improved after medication. Patient noted to be mildly hypotensive and tachycardic. IV fluids continuing.                         Medical Decision Making Amount and/or Complexity of Data  Reviewed Labs: ordered. Radiology: ordered.  Risk OTC drugs. Prescription drug management.   Patient discussed with and seen by Dr. Denton Lank.  Patient feeling better and tolerating oral intake after IV fluids and medications.  Patient with symptoms consistent with influenza.  CXR and CT of chest results reviewed. Findings suggestive of possible sarcoidosis or neoplasm. Recommend follow-up with PCP and/or pulmonolgy.   Patient will be discharged with instructions to orally hydrate, rest, and use over-the-counter medications such as Tylenol for fever and muscle aches.  Patient will also be given albuterol MDI for cough.         Final Clinical Impression(s) / ED Diagnoses Final diagnoses:  Influenza    Rx / DC Orders ED Discharge Orders          Ordered    triamcinolone cream (KENALOG) 0.1 %  2 times daily        05/01/21 2229              Felicie Morn, NP 05/01/21 2244    Cathren Laine, MD 05/02/21 [phone]

## 2021-05-06 ENCOUNTER — Emergency Department (HOSPITAL_COMMUNITY): Payer: Self-pay

## 2021-05-06 ENCOUNTER — Emergency Department (HOSPITAL_COMMUNITY)
Admission: EM | Admit: 2021-05-06 | Discharge: 2021-05-07 | Disposition: A | Discharge status: Home / Self Care | Payer: Self-pay | Attending: Emergency Medicine | Admitting: Emergency Medicine

## 2021-05-06 ENCOUNTER — Encounter (HOSPITAL_COMMUNITY): Payer: Self-pay

## 2021-05-06 DIAGNOSIS — D649 Anemia, unspecified: Secondary | ICD-10-CM | POA: Insufficient documentation

## 2021-05-06 DIAGNOSIS — E876 Hypokalemia: Secondary | ICD-10-CM | POA: Insufficient documentation

## 2021-05-06 DIAGNOSIS — J101 Influenza due to other identified influenza virus with other respiratory manifestations: Secondary | ICD-10-CM | POA: Insufficient documentation

## 2021-05-06 DIAGNOSIS — J111 Influenza due to unidentified influenza virus with other respiratory manifestations: Secondary | ICD-10-CM

## 2021-05-06 LAB — CBC WITH DIFFERENTIAL/PLATELET
Abs Immature Granulocytes: 0.01 10*3/uL (ref 0.00–0.07)
Basophils Absolute: 0 10*3/uL (ref 0.0–0.1)
Basophils Relative: 0 %
Eosinophils Absolute: 0.1 10*3/uL (ref 0.0–0.5)
Eosinophils Relative: 1 %
HCT: 32.9 % — ABNORMAL LOW (ref 36.0–46.0)
Hemoglobin: 11 g/dL — ABNORMAL LOW (ref 12.0–15.0)
Immature Granulocytes: 0 %
Lymphocytes Relative: 18 %
Lymphs Abs: 1.2 10*3/uL (ref 0.7–4.0)
MCH: 30.8 pg (ref 26.0–34.0)
MCHC: 33.4 g/dL (ref 30.0–36.0)
MCV: 92.2 fL (ref 80.0–100.0)
Monocytes Absolute: 0.8 10*3/uL (ref 0.1–1.0)
Monocytes Relative: 12 %
Neutro Abs: 4.4 10*3/uL (ref 1.7–7.7)
Neutrophils Relative %: 69 %
Platelets: 185 10*3/uL (ref 150–400)
RBC: 3.57 MIL/uL — ABNORMAL LOW (ref 3.87–5.11)
RDW: 13.5 % (ref 11.5–15.5)
WBC: 6.3 10*3/uL (ref 4.0–10.5)
nRBC: 0 % (ref 0.0–0.2)

## 2021-05-06 LAB — COMPREHENSIVE METABOLIC PANEL
ALT: 19 U/L (ref 0–44)
AST: 27 U/L (ref 15–41)
Albumin: 3.1 g/dL — ABNORMAL LOW (ref 3.5–5.0)
Alkaline Phosphatase: 43 U/L (ref 38–126)
Anion gap: 10 (ref 5–15)
BUN: <5 mg/dL — ABNORMAL LOW (ref 6–20)
CO2: 25 mmol/L (ref 22–32)
Calcium: 8.7 mg/dL — ABNORMAL LOW (ref 8.9–10.3)
Chloride: 99 mmol/L (ref 98–111)
Creatinine, Ser: 0.45 mg/dL (ref 0.44–1.00)
GFR, Estimated: >60 mL/min (ref >60)
Glucose, Bld: 90 mg/dL (ref 70–99)
Potassium: 3 mmol/L — ABNORMAL LOW (ref 3.5–5.1)
Sodium: 134 mmol/L — ABNORMAL LOW (ref 135–145)
Total Bilirubin: 1 mg/dL (ref 0.3–1.2)
Total Protein: 9.4 g/dL — ABNORMAL HIGH (ref 6.5–8.1)

## 2021-05-06 NOTE — ED Provider Triage Note (Signed)
Emergency Medicine Provider Triage Evaluation Note  Glean Salen , a 51 y.o. female  was evaluated in triage.  Pt complains of cough, nausea, vomiting, and diarrhea.  Tested positive for influenza 5 days ago.  States her general malaise is worse and myalgias are worse. No fever.   Review of Systems  Positive:  Negative: See above   Physical Exam  BP 115/69    Pulse 95    Temp 99.2 F (37.3 C) (Oral)    Resp (!) 22    SpO2 100%  Gen:   Awake, no distress   Resp:  Normal effort  MSK:   Moves extremities without difficulty  Other:    Medical Decision Making  Medically screening exam initiated at 8:43 PM.  Appropriate orders placed.  Glean Salen was informed that the remainder of the evaluation will be completed by another provider, this initial triage assessment does not replace that evaluation, and the importance of remaining in the ED until their evaluation is complete.     Honor Loh [place], [place] 05/06/21 2045

## 2021-05-06 NOTE — ED Triage Notes (Signed)
Pt states that she tested positive for the flu on Wed and has has generalized body aches, fevers and SOB

## 2021-05-07 MED ORDER — POTASSIUM CHLORIDE CRYS ER 20 MEQ PO TBCR: 20.0000 meq | EXTENDED_RELEASE_TABLET | Freq: Every day | ORAL | 0 refills | Status: AC

## 2021-05-07 MED ORDER — ONDANSETRON 4 MG PO TBDP
4.0000 mg | ORAL_TABLET | Freq: Once | ORAL | Status: AC
  Administered 2021-05-07: 4 mg via ORAL
  Filled 2021-05-07: qty 1

## 2021-05-07 MED ORDER — HYDROCOD POLI-CHLORPHE POLI ER 10-8 MG/5ML PO SUER: 5.0000 mL | Freq: Two times a day (BID) | ORAL | 0 refills | Status: AC | PRN

## 2021-05-07 MED ORDER — HYDROCOD POLI-CHLORPHE POLI ER 10-8 MG/5ML PO SUER
5.0000 mL | Freq: Once | ORAL | Status: AC
  Administered 2021-05-07: 5 mL via ORAL
  Filled 2021-05-07 (×2): qty 5

## 2021-05-07 MED ORDER — AEROCHAMBER Z-STAT PLUS/MEDIUM MISC
1.0000 | Freq: Once | Status: AC
  Administered 2021-05-07: 1
  Filled 2021-05-07: qty 1

## 2021-05-07 MED ORDER — PREDNISONE 10 MG (21) PO TBPK: ORAL_TABLET | Freq: Every day | ORAL | 0 refills | Status: AC

## 2021-05-07 MED ORDER — POTASSIUM CHLORIDE CRYS ER 20 MEQ PO TBCR: 40.0000 meq | EXTENDED_RELEASE_TABLET | Freq: Once | ORAL | Status: DC

## 2021-05-07 MED ORDER — DEXAMETHASONE SODIUM PHOSPHATE 10 MG/ML IJ SOLN
10.0000 mg | Freq: Once | INTRAMUSCULAR | Status: AC
  Administered 2021-05-07: 10 mg via INTRAMUSCULAR
  Filled 2021-05-07: qty 1

## 2021-05-07 MED ORDER — ALBUTEROL SULFATE HFA 108 (90 BASE) MCG/ACT IN AERS
2.0000 | INHALATION_SPRAY | RESPIRATORY_TRACT | Status: DC | PRN
  Filled 2021-05-07: qty 6.7

## 2021-05-07 NOTE — Discharge Instructions (Addendum)
Your potassium was low today.  You need to get this level rechecked by your doctor in about a week.

## 2021-05-07 NOTE — ED Provider Notes (Signed)
[facility] EMERGENCY DEPARTMENT Provider Note   CSN: [id number] Arrival date & time: 05/06/21  2021     History  Chief Complaint  Patient presents with   Influenza    Lisa Foley is a 51 y.o. female.  Pt is a 51 yo bf with a hx of Lupus and recent dx of the flu.  Pt said she has felt ill for the past 5 days.  She has a cough which makes her throw up.  Her whole body aches.  She has had fevers.  She did have a nose bleed while waiting in the [place].  It resolved after blowing her nose.      Home Medications Prior to Admission medications   Medication Sig Start Date End Date Taking? Authorizing Provider  chlorpheniramine-HYDROcodone (TUSSIONEX PENNKINETIC ER) 10-8 MG/5ML Take 5 mLs by mouth every 12 (twelve) hours as needed for cough. 05/07/21  Yes Isla Pence, MD  potassium chloride SA (KLOR-CON M) 20 MEQ tablet Take 1 tablet (20 mEq total) by mouth daily. 05/07/21  Yes Isla Pence, MD  predniSONE (STERAPRED UNI-PAK 21 TAB) 10 MG (21) TBPK tablet Take by mouth daily. Take 6 tabs by mouth daily  for 2 days, then 5 tabs for 2 days, then 4 tabs for 2 days, then 3 tabs for 2 days, 2 tabs for 2 days, then 1 tab by mouth daily for 2 days 05/07/21  Yes Isla Pence, MD  acetaminophen (TYLENOL) 500 MG tablet Take 2 tablets (1,000 mg total) by mouth every 8 (eight) hours as needed for mild pain. Do NOT exceed 4,000mg  in 24 hours Patient taking differently: Take 500 mg by mouth as needed for moderate pain or headache. 09/07/18   Patrecia Pour, MD  budesonide-formoterol [facility]) 160-4.5 MCG/ACT inhaler Inhale 2 puffs into the lungs 2 (two) times daily. 09/07/18 05/01/21  Patrecia Pour, MD  Menthol, Topical Analgesic, (ICY HOT EX) Apply 1-2 each topically 2 (two) times daily as needed (pain).    [provider]  triamcinolone cream (KENALOG) 0.1 % Apply 1 application topically 2 (two) times daily. 05/01/21   Etta Quill, NP      Allergies    Advair hfa  [fluticasone-salmeterol] and Aleve [naproxen sodium]    Review of Systems   Review of Systems  Respiratory:  Positive for cough.   Musculoskeletal:  Positive for myalgias.  All other systems reviewed and are negative.  Physical Exam Updated Vital Signs BP 125/72 (BP Location: Right Arm)    Pulse 88    Temp 100.2 F (37.9 C) (Oral)    Resp 17    LMP 07/12/2017 (Approximate)    SpO2 94%  Physical Exam Vitals and nursing note reviewed.  Constitutional:      Appearance: Normal appearance. She is ill-appearing.  HENT:     Head: Normocephalic and atraumatic.     Right Ear: External ear normal.     Left Ear: External ear normal.     Nose: Nose normal.     Mouth/Throat:     Mouth: Mucous membranes are dry.  Eyes:     Extraocular Movements: Extraocular movements intact.     Conjunctiva/sclera: Conjunctivae normal.     Pupils: Pupils are equal, round, and reactive to light.  Cardiovascular:     Rate and Rhythm: Normal rate and regular rhythm.     Pulses: Normal pulses.     Heart sounds: Normal heart sounds.  Pulmonary:     Effort: Pulmonary effort is normal.  Breath sounds: Normal breath sounds.  Abdominal:     General: Abdomen is flat. Bowel sounds are normal.     Palpations: Abdomen is soft.  Musculoskeletal:        General: Normal range of motion.     Cervical back: Normal range of motion and neck supple.  Skin:    General: Skin is warm.     Capillary Refill: Capillary refill takes less than 2 seconds.  Neurological:     General: No focal deficit present.     Mental Status: She is alert.  Psychiatric:        Mood and Affect: Mood normal.        Behavior: Behavior normal.    ED Results / Procedures / Treatments   Labs (all labs ordered are listed, but only abnormal results are displayed) Labs Reviewed  COMPREHENSIVE METABOLIC PANEL - Abnormal; Notable for the following components:      Result Value   Sodium 134 (*)    Potassium 3.0 (*)    BUN <5 (*)    Calcium  8.7 (*)    Total Protein 9.4 (*)    Albumin 3.1 (*)    All other components within normal limits  CBC WITH DIFFERENTIAL/PLATELET - Abnormal; Notable for the following components:   RBC 3.57 (*)    Hemoglobin 11.0 (*)    HCT 32.9 (*)    All other components within normal limits    EKG EKG Interpretation  Date/Time:  Monday May 06 2021 20:27:14 EST Ventricular Rate:  95 PR Interval:  182 QRS Duration: 82 QT Interval:  340 QTC Calculation: 427 R Axis:   42 Text Interpretation: Normal sinus rhythm Normal ECG When compared with ECG of 03-Jan-2021 17:49, PREVIOUS ECG IS PRESENT No significant change since last tracing Confirmed by Blanchie Dessert [phone]) on 05/07/2021 8:19:07 AM  Radiology DG Chest 2 View  Result Date: 05/06/2021 CLINICAL DATA:  Cough and shortness of breath. EXAM: CHEST - 2 VIEW COMPARISON:  Chest x-ray and chest CT 05/01/2021 FINDINGS: The cardiac silhouette, mediastinal and hilar contours are stable. Chronic underlying lung disease without definite acute overlying pulmonary process. No pleural effusions or pneumothorax. IMPRESSION: Chronic lung disease without definite acute overlying pulmonary process. Electronically Signed   By: Marijo Sanes M.D.   On: 05/06/2021 21:16    Procedures Procedures    Medications Ordered in ED Medications  albuterol (VENTOLIN HFA) 108 (90 Base) MCG/ACT inhaler 2 puff (has no administration in time range)  potassium chloride SA (KLOR-CON M) CR tablet 40 mEq (has no administration in time range)  aerochamber Z-Stat Plus/medium 1 each (1 each Other Given 05/07/21 0836)  dexamethasone (DECADRON) injection 10 mg (10 mg Intramuscular Given 05/07/21 0836)  chlorpheniramine-HYDROcodone 10-8 MG/5ML suspension 5 mL (5 mLs Oral Given 05/07/21 0904)  ondansetron (ZOFRAN-ODT) disintegrating tablet 4 mg (4 mg Oral Given 05/07/21 [id number])    ED Course/ Medical Decision Making/ A&P                           Medical Decision  Making Risk Prescription drug management.   This patient presents to the ED for concern of flu, this involves an extensive number of treatment options, and is a complaint that carries with it a high risk of complications and morbidity.  The differential diagnosis includes flu, pna   Co morbidities that complicate the patient evaluation  lupus   Additional history obtained:  Additional history obtained from epic  chart review   Lab Tests:  I Ordered, and personally interpreted labs.  The pertinent results include:  chronic anemia, K is slightly low   Imaging Studies ordered:  I ordered imaging studies including cxr  I independently visualized and interpreted imaging which showed    IMPRESSION:  Chronic lung disease without definite acute overlying pulmonary  process.   I agree with the radiologist interpretation   Cardiac Monitoring:  The patient was maintained on a cardiac monitor.  I personally viewed and interpreted the cardiac monitored which showed an underlying rhythm of: nsr   Medicines ordered and prescription drug management:  I ordered medication including tussionex, albuterol for cough and kdur for hypokalemia   Reevaluation of the patient after these medicines showed that the patient improved I have reviewed the patients home medicines and have made adjustments as needed   Test Considered:  Ct chest. Pt just had one a few days ago.  She is not hypoxic or tachycardic.  I don't think it needs to be repeated.  Problem List / ED Course:  Influenza A:  Pt's sx treated with tussionex, steroids, and albuterol inhaler + spacer. Hypokalemia:  40 meq kdur given in ED.  Pt d/c with a 2 week supply of K.  She is to get her K rechecked by her pcp in a week.   Reevaluation:  After the interventions noted above, I reevaluated the patient and found that they have :improved   Social Determinants of Health:  No health insurance.  She is followed at  [facility].   Dispostion:  After consideration of the diagnostic results and the patients response to treatment, I feel that the patent would benefit from discharge with outpatient f/u.          Final Clinical Impression(s) / ED Diagnoses Final diagnoses:  Influenza  Hypokalemia    Rx / DC Orders ED Discharge Orders          Ordered    chlorpheniramine-HYDROcodone (TUSSIONEX PENNKINETIC ER) 10-8 MG/5ML  Every 12 hours PRN        05/07/21 0834    predniSONE (STERAPRED UNI-PAK 21 TAB) 10 MG (21) TBPK tablet  Daily        05/07/21 0834    potassium chloride SA (KLOR-CON M) 20 MEQ tablet  Daily        05/07/21 0901              Isla Pence, MD 05/07/21 [phone]

## 2021-09-30 ENCOUNTER — Emergency Department (HOSPITAL_COMMUNITY)
Admission: EM | Admit: 2021-09-30 | Discharge: 2021-09-30 | Disposition: A | Discharge status: Home / Self Care | Payer: Self-pay | Attending: Emergency Medicine | Admitting: Emergency Medicine

## 2021-09-30 ENCOUNTER — Emergency Department (HOSPITAL_COMMUNITY): Payer: Self-pay

## 2021-09-30 ENCOUNTER — Encounter (HOSPITAL_COMMUNITY): Payer: Self-pay | Admitting: Emergency Medicine

## 2021-09-30 DIAGNOSIS — M79604 Pain in right leg: Secondary | ICD-10-CM | POA: Insufficient documentation

## 2021-09-30 DIAGNOSIS — R0789 Other chest pain: Secondary | ICD-10-CM | POA: Insufficient documentation

## 2021-09-30 DIAGNOSIS — M542 Cervicalgia: Secondary | ICD-10-CM | POA: Insufficient documentation

## 2021-09-30 LAB — COMPREHENSIVE METABOLIC PANEL
ALT: 25 U/L (ref 0–44)
AST: 31 U/L (ref 15–41)
Albumin: 3.4 g/dL — ABNORMAL LOW (ref 3.5–5.0)
Alkaline Phosphatase: 42 U/L (ref 38–126)
Anion gap: 6 (ref 5–15)
BUN: 11 mg/dL (ref 6–20)
CO2: 30 mmol/L (ref 22–32)
Calcium: 9.3 mg/dL (ref 8.9–10.3)
Chloride: 101 mmol/L (ref 98–111)
Creatinine, Ser: 0.34 mg/dL — ABNORMAL LOW (ref 0.44–1.00)
GFR, Estimated: >60 mL/min (ref >60)
Glucose, Bld: 87 mg/dL (ref 70–99)
Potassium: 3.6 mmol/L (ref 3.5–5.1)
Sodium: 137 mmol/L (ref 135–145)
Total Bilirubin: 0.7 mg/dL (ref 0.3–1.2)
Total Protein: 9.4 g/dL — ABNORMAL HIGH (ref 6.5–8.1)

## 2021-09-30 LAB — CBC WITH DIFFERENTIAL/PLATELET
Abs Immature Granulocytes: 0.01 10*3/uL (ref 0.00–0.07)
Basophils Absolute: 0 10*3/uL (ref 0.0–0.1)
Basophils Relative: 0 %
Eosinophils Absolute: 0.2 10*3/uL (ref 0.0–0.5)
Eosinophils Relative: 3 %
HCT: 39.2 % (ref 36.0–46.0)
Hemoglobin: 12.6 g/dL (ref 12.0–15.0)
Immature Granulocytes: 0 %
Lymphocytes Relative: 25 %
Lymphs Abs: 1.2 10*3/uL (ref 0.7–4.0)
MCH: 30.2 pg (ref 26.0–34.0)
MCHC: 32.1 g/dL (ref 30.0–36.0)
MCV: 94 fL (ref 80.0–100.0)
Monocytes Absolute: 0.5 10*3/uL (ref 0.1–1.0)
Monocytes Relative: 10 %
Neutro Abs: 3 10*3/uL (ref 1.7–7.7)
Neutrophils Relative %: 62 %
Platelets: 211 10*3/uL (ref 150–400)
RBC: 4.17 MIL/uL (ref 3.87–5.11)
RDW: 13.9 % (ref 11.5–15.5)
WBC: 4.8 10*3/uL (ref 4.0–10.5)
nRBC: 0 % (ref 0.0–0.2)

## 2021-09-30 LAB — TROPONIN I (HIGH SENSITIVITY): Troponin I (High Sensitivity): 5 ng/L (ref <18)

## 2021-09-30 MED ORDER — OXYCODONE-ACETAMINOPHEN 5-325 MG PO TABS: ORAL_TABLET | ORAL | 0 refills | Status: AC

## 2021-09-30 MED ORDER — OXYCODONE-ACETAMINOPHEN 5-325 MG PO TABS
1.0000 | ORAL_TABLET | Freq: Once | ORAL | Status: AC
  Administered 2021-09-30: 1 via ORAL
  Filled 2021-09-30: qty 1

## 2021-09-30 NOTE — Discharge Instructions (Signed)
Follow-up as planned with your doctor to this week.

## 2021-09-30 NOTE — ED Provider Notes (Signed)
[facility]-EMERGENCY DEPT Provider Note   CSN: [id number] Arrival date & time: 09/30/21  0751     History  Chief Complaint  Patient presents with   Chest Pain   Leg Pain   Neck Pain    Lisa Foley is a 51 y.o. female.  Patient has a history of lupus.  She comes in complaining of left-sided chest pain that is worse with movement and has been going on a number of days.  No fever no chills no cough no shortness of breath  The history is provided by the patient and medical records. No language interpreter was used.  Chest Pain Pain location:  L chest Pain quality: aching   Pain radiates to:  Does not radiate Pain severity:  Moderate Onset quality:  Gradual Timing:  Intermittent Progression:  Waxing and waning Chronicity:  New Context: not breathing   Relieved by:  Nothing Exacerbated by: Movement. Ineffective treatments: Tylenol. Associated symptoms: no abdominal pain, no back pain, no cough, no fatigue and no headache   Leg Pain Associated symptoms: neck pain   Associated symptoms: no back pain and no fatigue   Neck Pain Associated symptoms: chest pain and leg pain   Associated symptoms: no headaches        Home Medications Prior to Admission medications   Medication Sig Start Date End Date Taking? Authorizing Provider  oxyCODONE-acetaminophen (PERCOCET) 5-325 MG tablet Take one pill every 6 hours for pain not relieved by tylenol or motrin 09/30/21  Yes Bethann Berkshire, MD  acetaminophen (TYLENOL) 500 MG tablet Take 2 tablets (1,000 mg total) by mouth every 8 (eight) hours as needed for mild pain. Do NOT exceed 4,000mg  in 24 hours Patient taking differently: Take 500 mg by mouth as needed for moderate pain or headache. 09/07/18   Tyrone Nine, MD  budesonide-formoterol [facility]) 160-4.5 MCG/ACT inhaler Inhale 2 puffs into the lungs 2 (two) times daily. 09/07/18 05/01/21  Tyrone Nine, MD  chlorpheniramine-HYDROcodone (TUSSIONEX  PENNKINETIC ER) 10-8 MG/5ML Take 5 mLs by mouth every 12 (twelve) hours as needed for cough. 05/07/21   Jacalyn Lefevre, MD  Menthol, Topical Analgesic, (ICY HOT EX) Apply 1-2 each topically 2 (two) times daily as needed (pain).    [provider]  potassium chloride SA (KLOR-CON M) 20 MEQ tablet Take 1 tablet (20 mEq total) by mouth daily. 05/07/21   Jacalyn Lefevre, MD  predniSONE (STERAPRED UNI-PAK 21 TAB) 10 MG (21) TBPK tablet Take by mouth daily. Take 6 tabs by mouth daily  for 2 days, then 5 tabs for 2 days, then 4 tabs for 2 days, then 3 tabs for 2 days, 2 tabs for 2 days, then 1 tab by mouth daily for 2 days 05/07/21   Jacalyn Lefevre, MD  triamcinolone cream (KENALOG) 0.1 % Apply 1 application topically 2 (two) times daily. 05/01/21   Felicie Morn, NP      Allergies    Advair hfa [fluticasone-salmeterol] and Aleve [naproxen sodium]    Review of Systems   Review of Systems  Constitutional:  Negative for appetite change and fatigue.  HENT:  Negative for congestion, ear discharge and sinus pressure.   Eyes:  Negative for discharge.  Respiratory:  Negative for cough.   Cardiovascular:  Positive for chest pain.  Gastrointestinal:  Negative for abdominal pain and diarrhea.  Genitourinary:  Negative for frequency and hematuria.  Musculoskeletal:  Positive for neck pain. Negative for back pain.  Skin:  Negative for rash.  Neurological:  Negative for seizures and headaches.  Psychiatric/Behavioral:  Negative for hallucinations.     Physical Exam Updated Vital Signs BP 120/78   Pulse 69   Temp 98.2 F (36.8 C) (Oral)   Resp 16   LMP 07/12/2017 (Approximate)   SpO2 96%  Physical Exam Vitals and nursing note reviewed.  Constitutional:      Appearance: She is well-developed.  HENT:     Head: Normocephalic.     Nose: Nose normal.  Eyes:     General: No scleral icterus.    Conjunctiva/sclera: Conjunctivae normal.  Neck:     Thyroid: No thyromegaly.  Cardiovascular:      Rate and Rhythm: Normal rate and regular rhythm.     Heart sounds: No murmur heard.    No friction rub. No gallop.  Pulmonary:     Breath sounds: No stridor. No wheezing or rales.  Chest:     Chest wall: Tenderness present.  Abdominal:     General: There is no distension.     Tenderness: There is no abdominal tenderness. There is no rebound.  Musculoskeletal:        General: Normal range of motion.     Cervical back: Neck supple.  Lymphadenopathy:     Cervical: No cervical adenopathy.  Skin:    Findings: No erythema or rash.  Neurological:     Mental Status: She is alert and oriented to person, place, and time.     Motor: No abnormal muscle tone.     Coordination: Coordination normal.  Psychiatric:        Behavior: Behavior normal.     ED Results / Procedures / Treatments   Labs (all labs ordered are listed, but only abnormal results are displayed) Labs Reviewed  COMPREHENSIVE METABOLIC PANEL - Abnormal; Notable for the following components:      Result Value   Creatinine, Ser 0.34 (*)    Total Protein 9.4 (*)    Albumin 3.4 (*)    All other components within normal limits  CBC WITH DIFFERENTIAL/PLATELET  TROPONIN I (HIGH SENSITIVITY)    EKG EKG Interpretation  Date/Time:  Monday September 30 2021 07:59:53 EDT Ventricular Rate:  92 PR Interval:  167 QRS Duration: 86 QT Interval:  361 QTC Calculation: 447 R Axis:   48 Text Interpretation: Sinus rhythm no acute ST/T changes no significant change since Feb 2023 Confirmed by Pricilla Loveless [phone]) on 09/30/2021 8:41:18 AM  Radiology DG Chest Port 1 View  Result Date: 09/30/2021 CLINICAL DATA:  Chest, neck and right leg pain. History of systemic lupus erythematosus. EXAM: PORTABLE CHEST 1 VIEW COMPARISON:  Radiographs 05/06/2021, 05/01/2021 and 07/19/2009. CT 05/01/2021. FINDINGS: 0843 hours. The heart size and mediastinal contours are stable. There is chronic interstitial lung disease with architectural distortion,  bronchiectasis and diffuse interstitial opacities. Compared with the most recent prior examination, there has been interval partial clearing of the left perihilar region. No new focal airspace disease, significant pleural effusion or pneumothorax. Mild degenerative changes in the spine. No acute osseous findings. Telemetry leads overlie the chest. IMPRESSION: Interval improved aeration of the left perihilar region with otherwise stable chronic lung disease. No evidence of acute cardiopulmonary process. Electronically Signed   By: Carey Bullocks M.D.   On: 09/30/2021 09:17    Procedures Procedures    Medications Ordered in ED Medications  oxyCODONE-acetaminophen (PERCOCET/ROXICET) 5-325 MG per tablet 1 tablet (1 tablet Oral Given 09/30/21 0915)    ED Course/ Medical Decision Making/ A&P  Medical Decision Making Amount and/or Complexity of Data Reviewed Labs: ordered. Radiology: ordered.  Risk Prescription drug management.  This patient presents to the ED for concern of chest pain, this involves an extensive number of treatment options, and is a complaint that carries with it a high risk of complications and morbidity.  The differential diagnosis includes pneumonia, MI, chest wall pain   Co morbidities that complicate the patient evaluation  sarcoid   Additional history obtained:  Additional history obtained from pt External records from outside source obtained and reviewed including hospital records CBC chemistries troponin   Lab Tests:  I Ordered, and personally interpreted labs.  The pertinent results include: CBC chemistry troponin negative   Imaging Studies ordered:  I ordered imaging studies including chest x-ray I independently visualized and interpreted imaging which showed chronic lung disease I agree with the radiologist interpretation   Cardiac Monitoring: / EKG:  The patient was maintained on a cardiac monitor.  I personally viewed  and interpreted the cardiac monitored which showed an underlying rhythm of: Normal sinus rhythm   Consultations Obtained:  No consult  Problem List / ED Course / Critical interventions / Medication management  Sarcoid chest pain I ordered medication including Percocet for pain Reevaluation of the patient after these medicines showed that the patient improved I have reviewed the patients home medicines and have made adjustments as needed   Social Determinants of Health:  None   Test / Admission - Considered:  No additional test needed  Patient with chest wall pain.  She is given some Percocets and will follow-up this week with her doctor        Final Clinical Impression(s) / ED Diagnoses Final diagnoses:  Atypical chest pain    Rx / DC Orders ED Discharge Orders          Ordered    oxyCODONE-acetaminophen (PERCOCET) 5-325 MG tablet        09/30/21 2426              Bethann Berkshire, MD 09/30/21 [phone]

## 2021-09-30 NOTE — ED Notes (Signed)
Pt states understanding of dc instructions, importance of follow up, and prescription. Pt denies questions or concerns upon dc. Pt declined wheelchair assistance upon dc. Pt ambulated out of ed w/ steady gait. No belongings left in room upon dc.  

## 2021-09-30 NOTE — ED Triage Notes (Signed)
Patient c/o pain in chest, neck, and R leg. States pain in R hip radiates down leg. Hx lupus and R side hip replacement.

## 2021-10-04 ENCOUNTER — Emergency Department (HOSPITAL_COMMUNITY): Payer: Self-pay

## 2021-10-04 ENCOUNTER — Encounter (HOSPITAL_COMMUNITY): Payer: Self-pay | Admitting: Emergency Medicine

## 2021-10-04 ENCOUNTER — Other Ambulatory Visit: Payer: Self-pay

## 2021-10-04 ENCOUNTER — Emergency Department (HOSPITAL_COMMUNITY)
Admission: EM | Admit: 2021-10-04 | Discharge: 2021-10-04 | Disposition: A | Discharge status: Home / Self Care | Payer: Self-pay | Attending: Emergency Medicine | Admitting: Emergency Medicine

## 2021-10-04 DIAGNOSIS — S20219A Contusion of unspecified front wall of thorax, initial encounter: Secondary | ICD-10-CM | POA: Diagnosis not present

## 2021-10-04 DIAGNOSIS — S79911A Unspecified injury of right hip, initial encounter: Secondary | ICD-10-CM | POA: Diagnosis present

## 2021-10-04 DIAGNOSIS — S7001XA Contusion of right hip, initial encounter: Secondary | ICD-10-CM | POA: Insufficient documentation

## 2021-10-04 DIAGNOSIS — S40011A Contusion of right shoulder, initial encounter: Secondary | ICD-10-CM | POA: Diagnosis not present

## 2021-10-04 DIAGNOSIS — Y92410 Unspecified street and highway as the place of occurrence of the external cause: Secondary | ICD-10-CM | POA: Diagnosis not present

## 2021-10-04 MED ORDER — METHOCARBAMOL 500 MG PO TABS
500.0000 mg | ORAL_TABLET | Freq: Once | ORAL | Status: AC
  Administered 2021-10-04: 500 mg via ORAL
  Filled 2021-10-04: qty 1

## 2021-10-04 MED ORDER — METHOCARBAMOL 500 MG PO TABS: 500.0000 mg | ORAL_TABLET | Freq: Two times a day (BID) | ORAL | 0 refills | Status: AC

## 2021-10-04 MED ORDER — ACETAMINOPHEN 500 MG PO TABS
1000.0000 mg | ORAL_TABLET | Freq: Once | ORAL | Status: AC
  Administered 2021-10-04: 1000 mg via ORAL
  Filled 2021-10-04: qty 2
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
## 2021-10-04 NOTE — ED Notes (Signed)
Pt transported to xray 

## 2021-10-16 ENCOUNTER — Other Ambulatory Visit: Payer: Self-pay

## 2021-10-16 ENCOUNTER — Encounter (HOSPITAL_COMMUNITY): Payer: Self-pay

## 2021-10-16 ENCOUNTER — Emergency Department (HOSPITAL_COMMUNITY): Payer: No Typology Code available for payment source

## 2021-10-16 ENCOUNTER — Emergency Department (HOSPITAL_COMMUNITY)
Admission: EM | Admit: 2021-10-16 | Discharge: 2021-10-16 | Disposition: A | Discharge status: Home / Self Care | Payer: No Typology Code available for payment source | Attending: Emergency Medicine | Admitting: Emergency Medicine

## 2021-10-16 DIAGNOSIS — M5441 Lumbago with sciatica, right side: Secondary | ICD-10-CM | POA: Diagnosis not present

## 2021-10-16 DIAGNOSIS — M25551 Pain in right hip: Secondary | ICD-10-CM

## 2021-10-16 DIAGNOSIS — M545 Low back pain, unspecified: Secondary | ICD-10-CM | POA: Diagnosis present
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
## 2021-10-16 NOTE — Discharge Instructions (Signed)
Please return to ED with any new or worsening symptoms Please follow up with PCP I have referred you to. Please call and make appointment to be seen Please continue taking tylenol for pain relief. Please also continue taking muscle relaxants you were provided on initial visit. Please read attached guide concerning hip pain and ways to treat at home
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
## 2023-12-22 IMAGING — CT CT CHEST W/O CM
2 of 4 series · 13 of 36 positions shown, 16 images · non-contrast
Comparison: Radiograph earlier today scattered additional prior
radiographs reviewed. Report from outside CT 06/19/2015 reviewed, no
images available.

CLINICAL DATA: Abnormal xray - lung opacity/opacities abnormal CXR
findings, evaluate for pneumonia



[Series 4: thorax 2.0 · axial · 0.97mm/px · z∈[+1115,+1417]mm · 10 of 169 slices shown, 13 images]
[im 9/169  mediastinal]
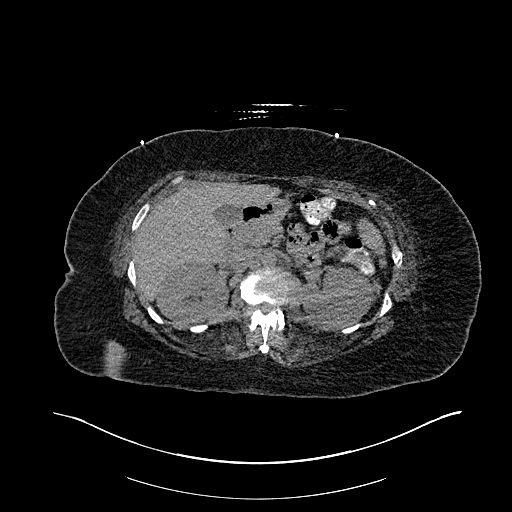
[im 9/169  lung]
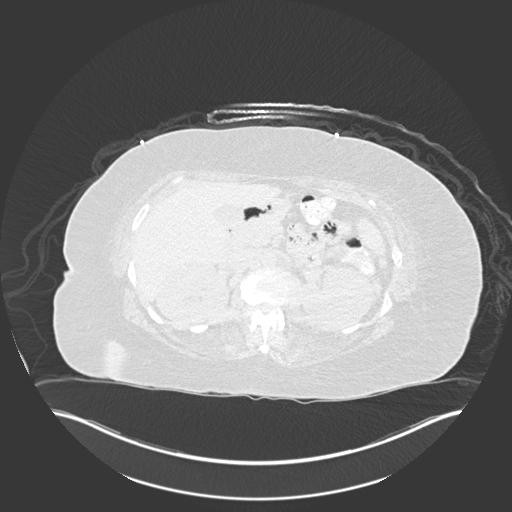
[im 27/169  lung]
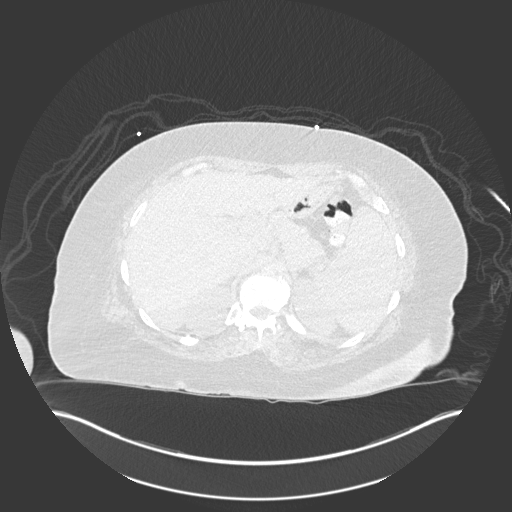
[im 45/169  lung]
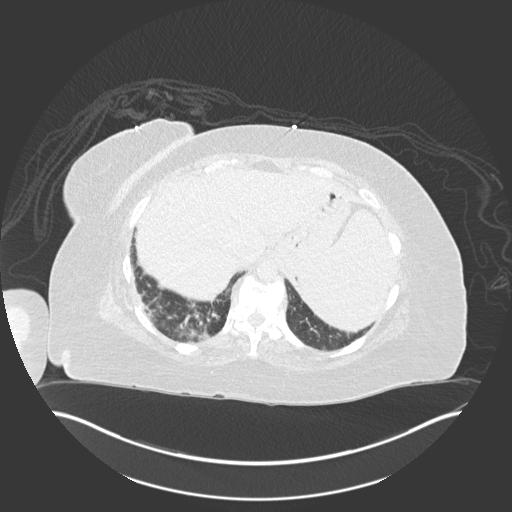
[im 62/169  lung]
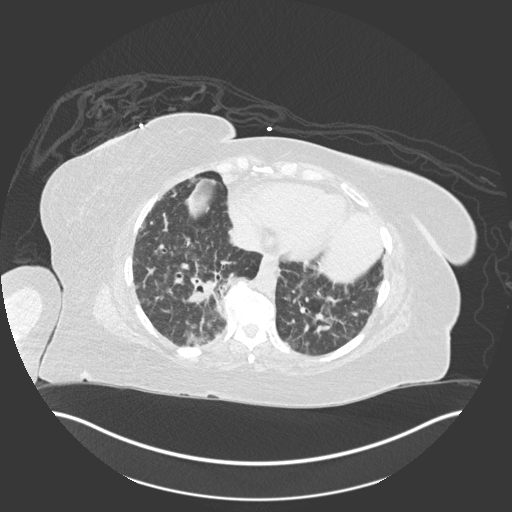
[im 80/169  mediastinal]
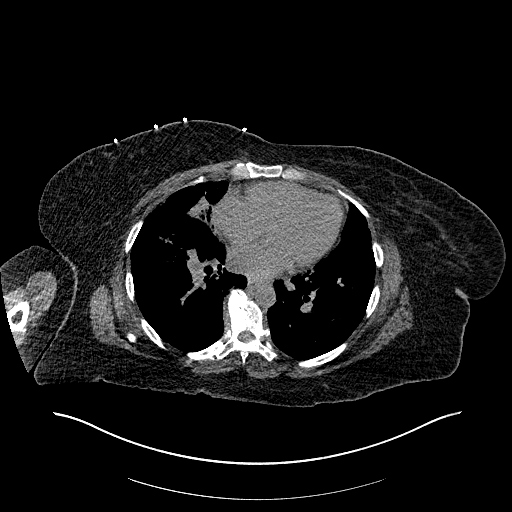
[im 80/169  lung]
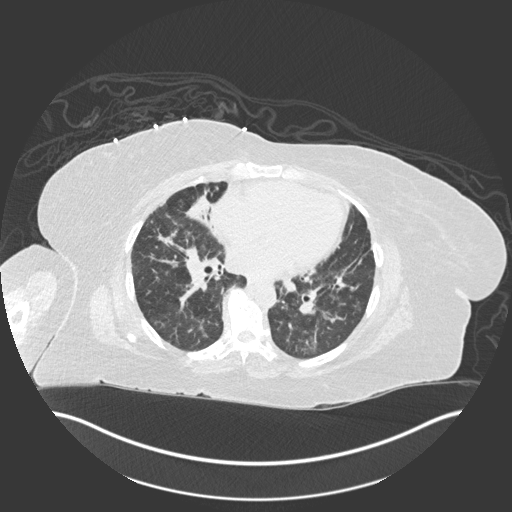
[im 89/169  lung]
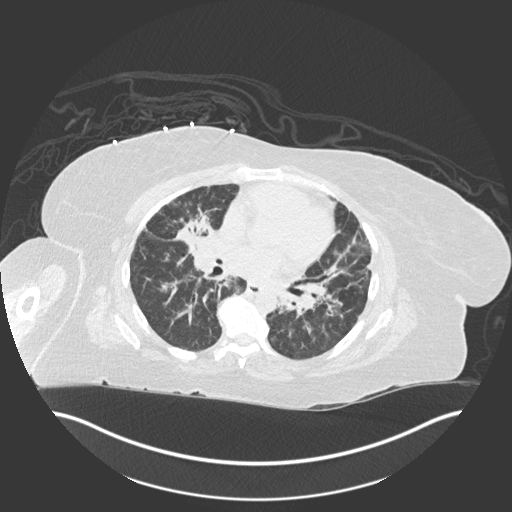
[im 107/169  lung]
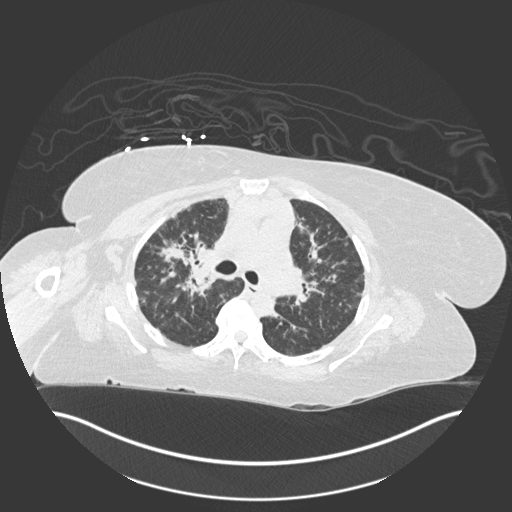
[im 124/169  lung]
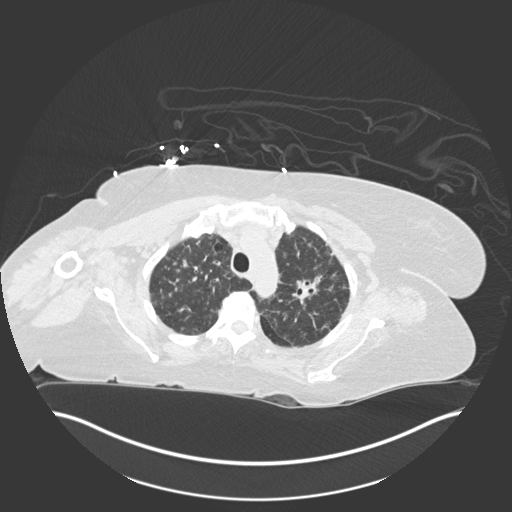
[im 142/169  mediastinal]
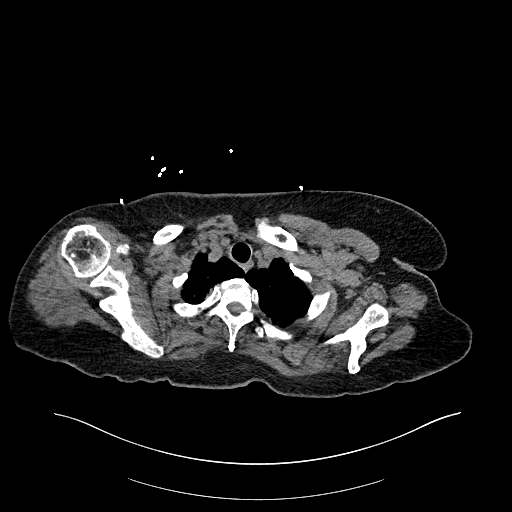
[im 142/169  lung]
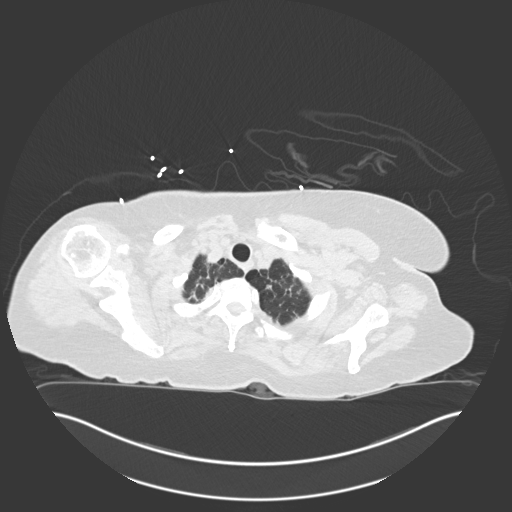
[im 160/169  lung]
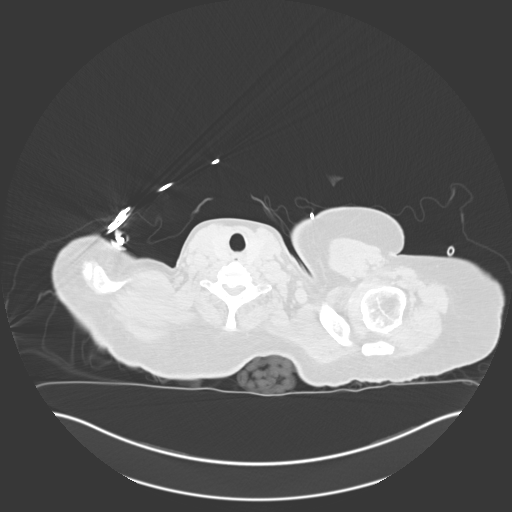

[Series 6: coronal · coronal · 0.68mm/px · 3 of 101 slices shown]
[im 21/101  lung]
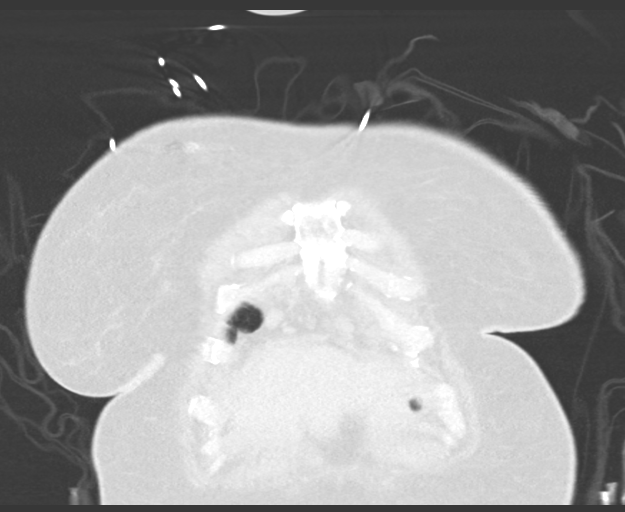
[im 41/101  lung]
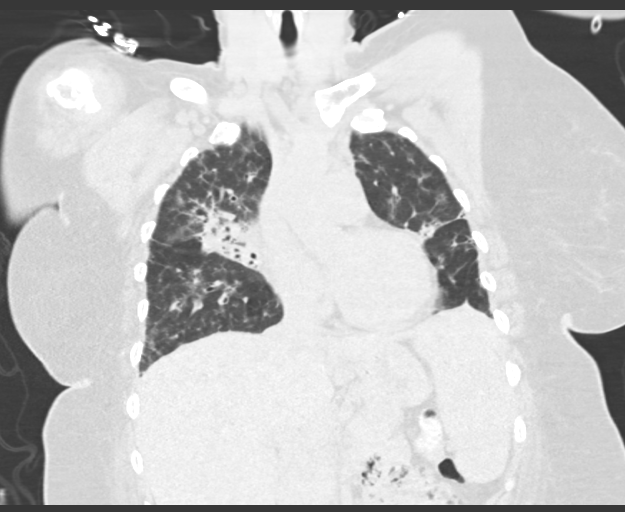
[im 61/101  lung]
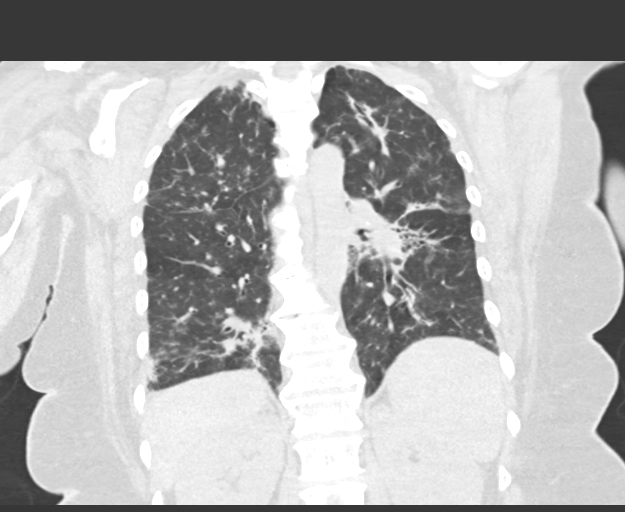

[13 of 36 positions shown; findings below may reference images not displayed]

FINDINGS: Cardiovascular: The heart is normal in size. The thoracic aorta is
normal in caliber. There is no pericardial effusion.

Mediastinum/Nodes: Homogeneous anterior mediastinal mass with
Hounsfield units of 57 measures 4.3 x 2.3 cm. Small calcified right
lower paratracheal and bilateral hilar lymph nodes. No bulky
noncalcified mediastinal adenopathy. Limited assessment for hilar
lymph nodes given lack of IV contrast. There is bilateral axillary
adenopathy is well as prominent subpectoral nodes, 11 mm low right
axillary node, series 3 image 31. 13 mm right subpectoral node
series 3, image 14. In elongated left hilar node spans 3.6 cm,
however this is primarily fatty hilum and has thin cortex, no
abnormal rounded morphology. No visualized thyroid nodule. Small
hiatal hernia.

Lungs/Pleura: There is underlying chronic lung disease with areas of
architectural distortion. Multifocal bronchiectasis, most prominent
centrally and affecting the perifissural lingular and right middle
lobes. Left upper lobe bronchiectasis has surrounding ill-defined
soft tissue density. There is ill-defined bilateral perihilar
density. Irregular nodular opacities in the right upper lobe
measuring 14 x 12 mm, series 8, image 59, and 17 x 12 mm series 8,
image 70 both of these nodules have irregular spiculated margins,
and are contiguous with right perihilar density. There are scattered
reticulonodular opacities within both lungs. No pleural effusion.

Upper Abdomen: Question of nodular hepatic contours. Spleen is
prominent size. There is a small posterior gastric diverticulum.

Musculoskeletal: Thoracic spondylosis with anterior spurring.
High-riding right humeral head. There are no acute or suspicious
osseous abnormalities. No chest wall soft tissue abnormalities or
obvious breast mass by CT.
IMPRESSION: 1. Underlying chronic lung disease. There is architectural
distortion as well as multifocal areas of bronchiectasis. There is
irregular soft tissue density in the region of bronchiectasis at the
left lung apex, in both perihilar regions. Recommend pulmonary
consultation. Consider follow-up high-resolution chest CT to assess
for interstitial lung disease.
2. Spiculated nodular opacities in the right upper lobe measuring
1-2 cm are contiguous with right perihilar soft tissue density. This
may be related to underlying chronic process, although the
possibility of superimposed infection or neoplasm is also
considered.
3. Diffuse reticulonodular opacities throughout both lungs, acuity
uncertain.
4. Calcified mediastinal and bilateral hilar lymph nodes. This may
represent sequela of prior granulomatous disease, or sarcoidosis,
given the parenchymal lung findings.
5. Bilateral axillary and subpectoral adenopathy, nonspecific but
may be reactive.
6. Homogeneous anterior mediastinal mass measuring 4.3 x 2.3 cm.
This may represent a recurrent thymus. This can be assessed on
follow-up, or consideration for PET-CT characterization based on
workup of underlying chronic lung disease could be considered.
7. Question of nodular hepatic contours, raising concern for
cirrhosis. Recommend correlation with cirrhosis risk factors.
8. Small hiatal hernia.

## 2023-12-22 IMAGING — CR DG CHEST 2V
2 series · 2 of 2 positions shown · non-contrast
Comparison: January 03, 21.

CLINICAL DATA: cough

EXAM:
CHEST - 2 VIEW

[chest lat]
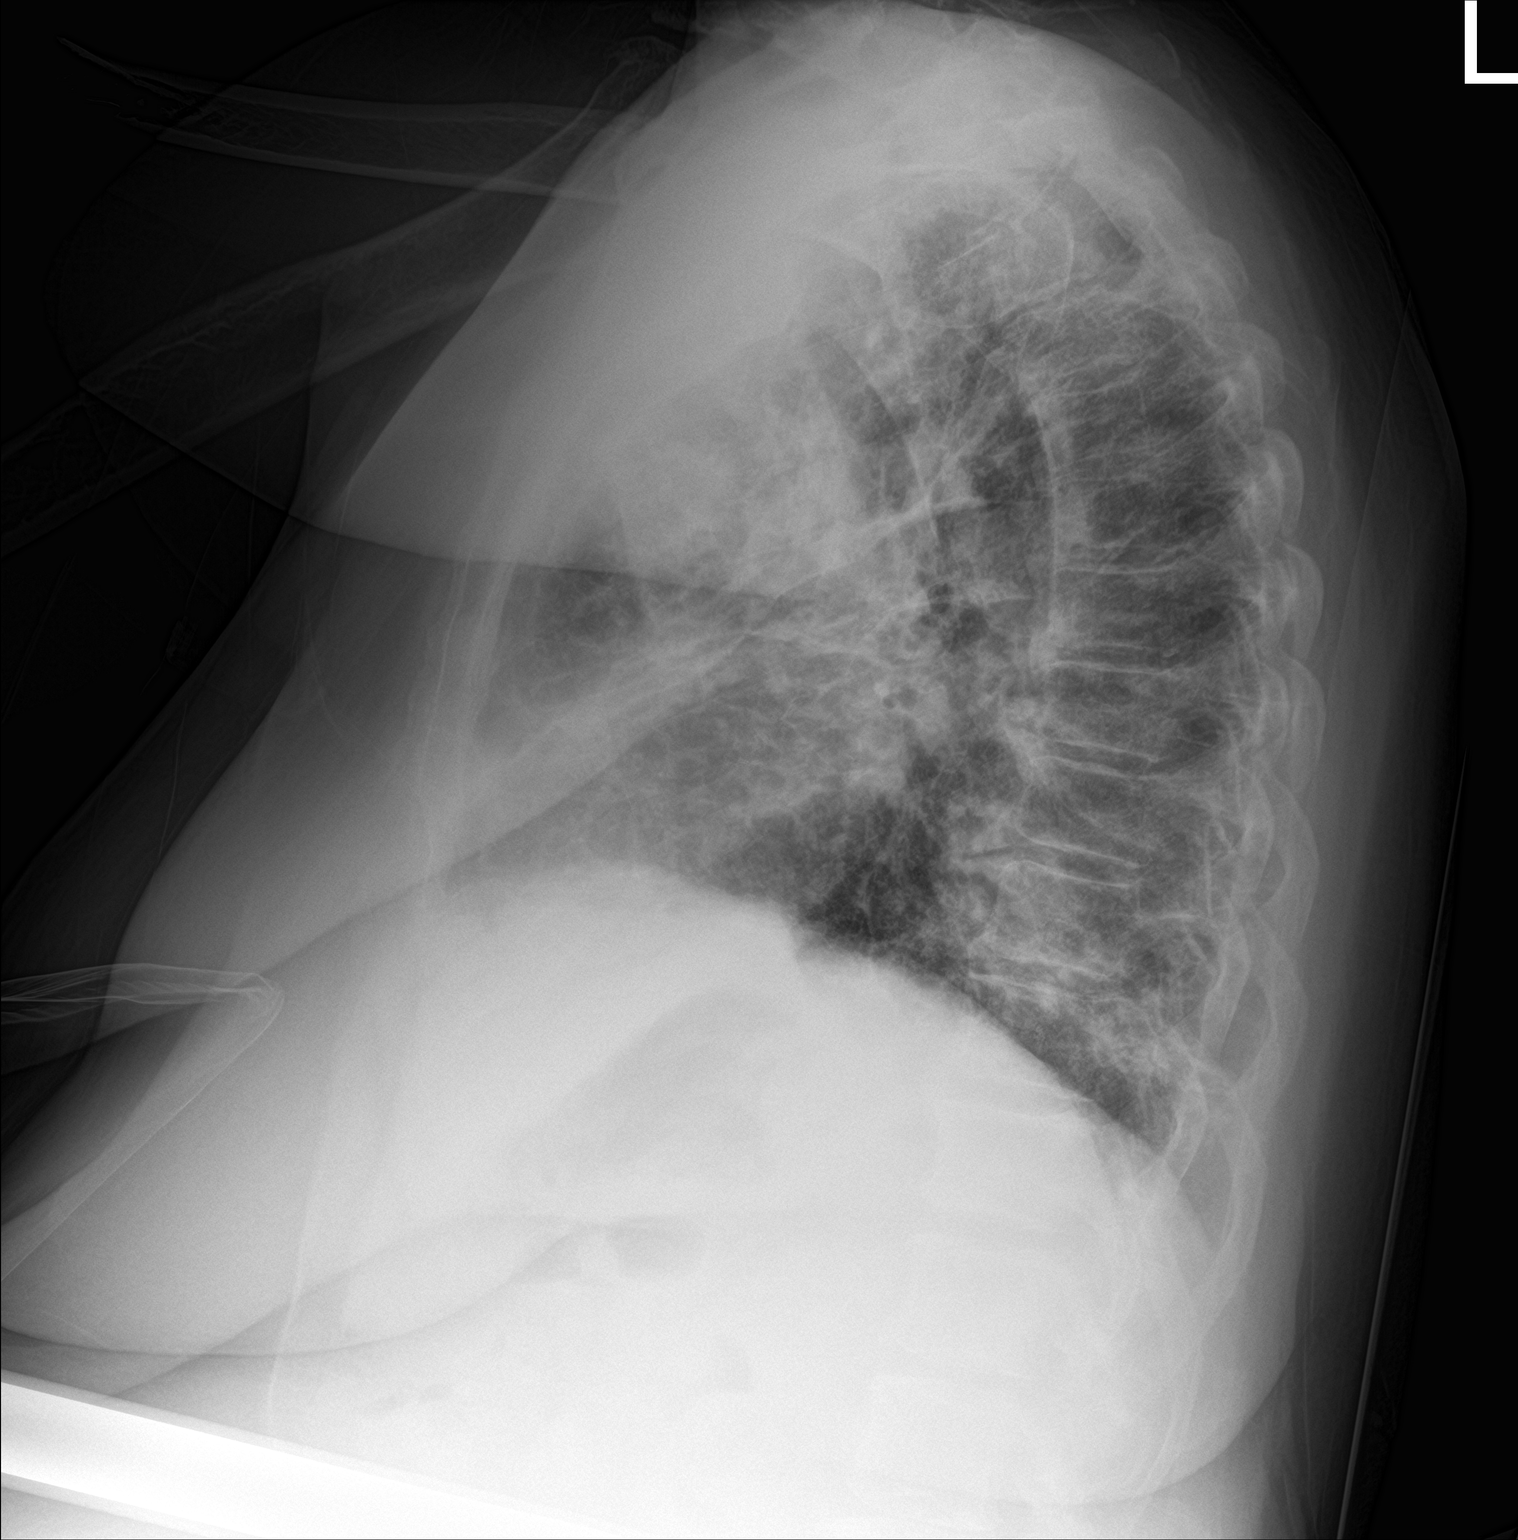

[chest ap]
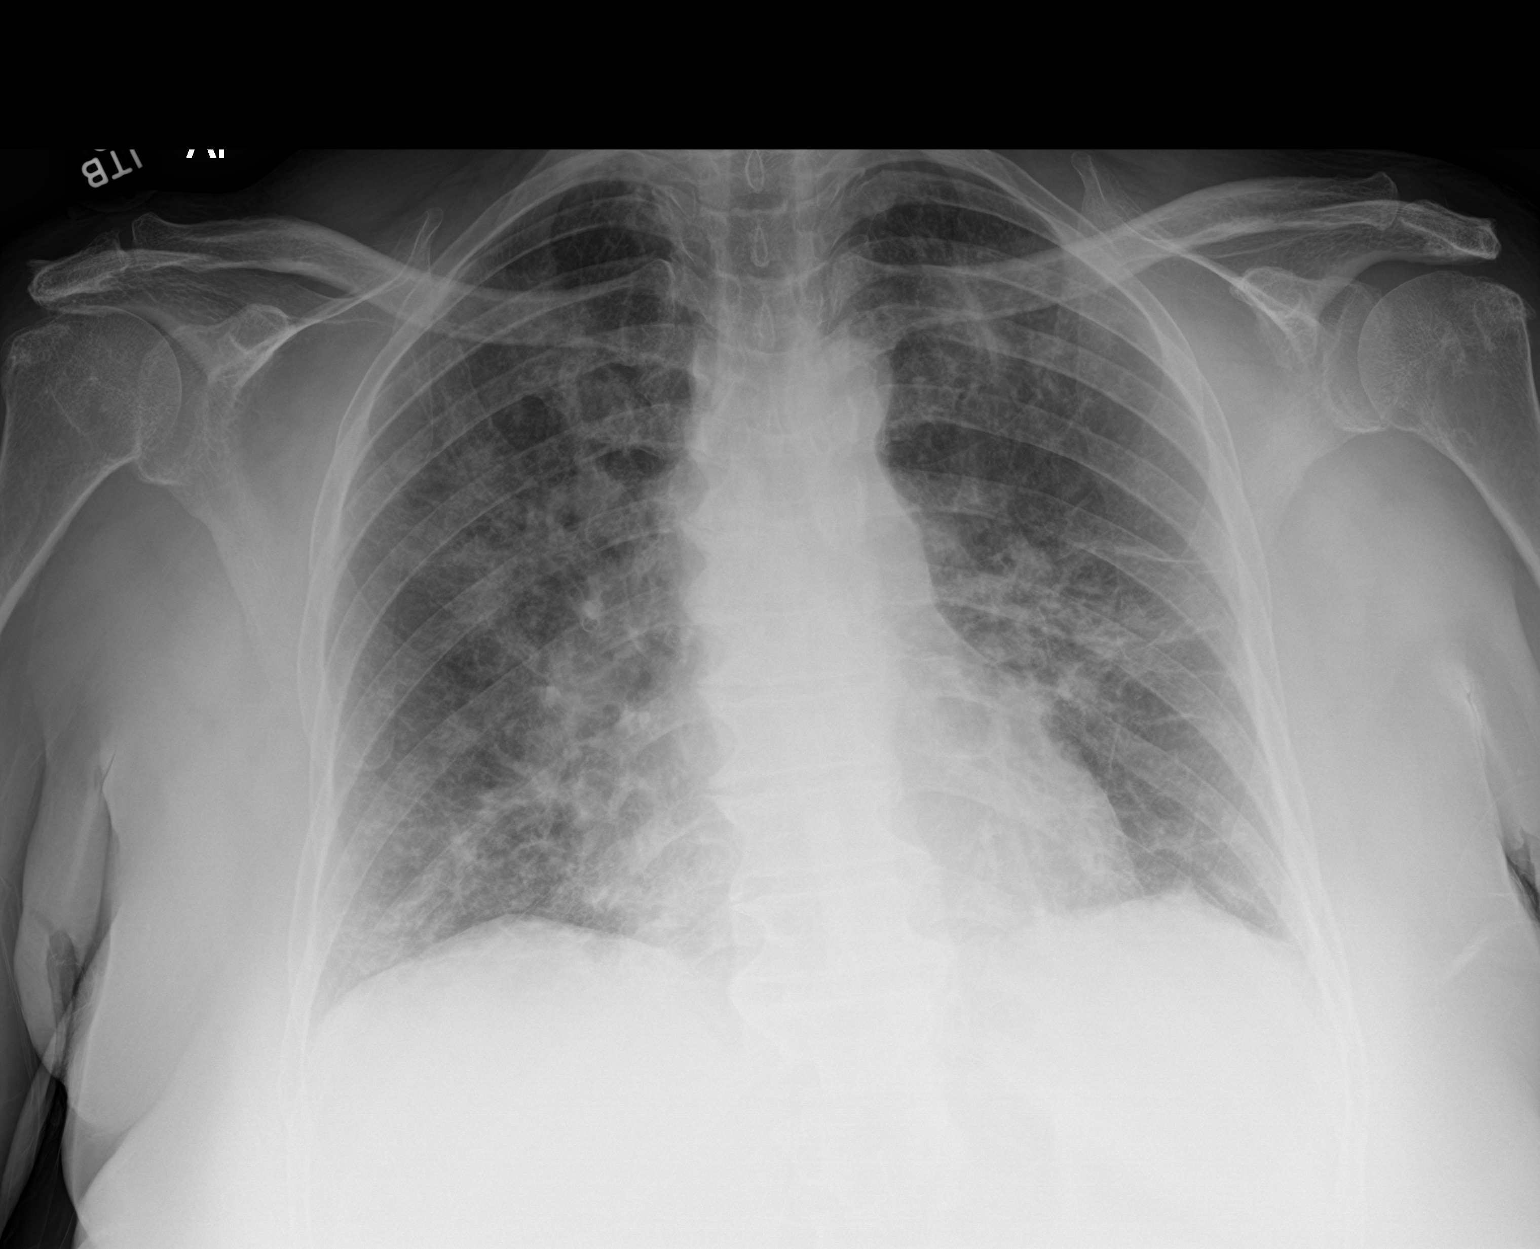

[2 of 2 positions shown; findings below may reference images not displayed]

FINDINGS: Chronic interstitial opacities bilaterally with mildly increased
airspace opacities in the right upper lung. No visible pleural
effusions or pneumothorax. Similar cardiomediastinal silhouette.
Degenerative changes of the spine.
IMPRESSION: The patient's chronic interstitial lung disease limits evaluation,
but there is mildly increased airspace opacities in the right upper
lung that could potentially represent superimposed pneumonia. Given
the extent of chronic lung disease, recommend follow-up chest CT to
better characterize and to establish a baseline.
